# Patient Record
Sex: Female | Born: 2000 | Race: Black or African American | Hispanic: No | Marital: Single | Smoking: Never smoker
Health system: Southern US, Community
[De-identification: ages and names within clinical notes are randomized; demographics above are authoritative.]

## PROBLEM LIST (undated history)

## (undated) DIAGNOSIS — A64 Unspecified sexually transmitted disease: Secondary | ICD-10-CM

## (undated) DIAGNOSIS — A549 Gonococcal infection, unspecified: Secondary | ICD-10-CM

## (undated) DIAGNOSIS — D649 Anemia, unspecified: Secondary | ICD-10-CM

## (undated) DIAGNOSIS — A749 Chlamydial infection, unspecified: Secondary | ICD-10-CM

## (undated) HISTORY — PX: HERNIA REPAIR: SHX51

## (undated) HISTORY — DX: Gonococcal infection, unspecified: A54.9

## (undated) HISTORY — DX: Unspecified sexually transmitted disease: A64

## (undated) HISTORY — DX: Chlamydial infection, unspecified: A74.9

---
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
## 2022-03-08 ENCOUNTER — Encounter: Payer: Self-pay | Admitting: Obstetrics & Gynecology

## 2022-03-11 ENCOUNTER — Encounter: Payer: Self-pay | Admitting: Obstetrics & Gynecology

## 2022-03-11 ENCOUNTER — Ambulatory Visit (INDEPENDENT_AMBULATORY_CARE_PROVIDER_SITE_OTHER): Payer: Commercial Managed Care - HMO | Admitting: Obstetrics & Gynecology

## 2022-03-11 VITALS — BP 118/80 | HR 89

## 2022-03-11 DIAGNOSIS — Z113 Encounter for screening for infections with a predominantly sexual mode of transmission: Secondary | ICD-10-CM | POA: Diagnosis not present

## 2022-03-11 DIAGNOSIS — N926 Irregular menstruation, unspecified: Secondary | ICD-10-CM

## 2022-03-11 DIAGNOSIS — Z3169 Encounter for other general counseling and advice on procreation: Secondary | ICD-10-CM

## 2022-03-11 LAB — PREGNANCY, URINE: Preg Test, Ur: NEGATIVE

## 2022-03-11 NOTE — Progress Notes (Signed)
    Hannah Ramos [birth year] [id number]        22 y.o.  G0 Living with her boyfriend.  RP: Irregular menstrual periods  HPI: Irregular menstrual periods.  Last periods 17 days apart. LMP 02/09/2022.  Had her last DepoProvera injection in 09/2021.  No contraception since then, would like to conceive. Sexually active.  H/O Gonorrhea and Chlamydia infections.  Last episode > 1 year ago.  Gono-Chlam Neg in 02/2021 and in 07/2021.     OB History  Gravida Para Term Preterm AB Living  0 0 0 0 0 0  SAB IAB Ectopic Multiple Live Births  0 0 0 0 0    Past medical history,surgical history, problem list, medications, allergies, family history and social history were all reviewed and documented in the EPIC chart.   Directed ROS with pertinent positives and negatives documented in the history of present illness/assessment and plan.  Exam:  Vitals:   03/11/22 1532  BP: 118/80  Pulse: 89  SpO2: 99%   General appearance:  Normal  Abdomen: Normal  Gynecologic exam: Vulva normal.  Speculum:  Cervix/Vagina normal.  SureSwab Advanced Plus done.  Bimanual exam:  Uterus AV, normal volume, mobile, NT.  No adnexal mass, NT.  UPT Neg   Assessment/Plan:  22 y.o. G0   1. Irregular menstrual cycle Irregular menstrual periods.  Last periods 17 days apart. LMP 02/09/2022.  Had her last DepoProvera injection in 09/2021.  No contraception since then, would like to conceive. Sexually active.  H/O Gonorrhea and Chlamydia infections.  Last episode > 1 year ago.  Gono-Chlam Neg in 02/2021 and in 07/2021.  SureSwab Advanced Plus pending.  UPT Neg today.  Counseling done on the fact that it may take up to a year after the last DepoProvera before resuming regular ovulatory cycles, which will be 09/2022 for her.  Recommend reevaluating if no regular cycles by then. - Pregnancy, urine  2. Encounter for preconception consultation Recommend PNVs, healthy nutrition and regular physical activities.  Timed IC when cycles  are regular again.  Consult early to r/o ectopic if HPT positive given the h/o Gono and Chlam in the past.  3. Screen for STD (sexually transmitted disease) - SureSwab Advanced Vaginitis Plus,TMA   Hannah Bruins MD, 3:43 PM 03/11/2022

## 2022-03-12 LAB — SURESWAB® ADVANCED VAGINITIS PLUS,TMA
C. trachomatis RNA, TMA: DETECTED — AB
CANDIDA SPECIES: NOT DETECTED
Candida glabrata: NOT DETECTED
N. gonorrhoeae RNA, TMA: DETECTED — AB
SURESWAB(R) ADV BACTERIAL VAGINOSIS(BV),TMA: POSITIVE — AB
TRICHOMONAS VAGINALIS (TV),TMA: NOT DETECTED

## 2022-03-31 ENCOUNTER — Other Ambulatory Visit: Payer: Self-pay

## 2022-03-31 MED ORDER — DOXYCYCLINE HYCLATE 100 MG PO TBEC: 100.0000 mg | DELAYED_RELEASE_TABLET | Freq: Two times a day (BID) | ORAL | 0 refills | Status: DC

## 2022-03-31 MED ORDER — TINIDAZOLE 500 MG PO TABS: ORAL_TABLET | ORAL | 0 refills | Status: DC

## 2022-04-01 ENCOUNTER — Telehealth: Payer: Self-pay | Admitting: *Deleted

## 2022-04-01 ENCOUNTER — Ambulatory Visit: Payer: Commercial Managed Care - HMO

## 2022-04-01 NOTE — Telephone Encounter (Addendum)
Mark the Pharmacist that works at [employer] called stating: They do not have the doxycycline (dorx) 100 mg in stock.   Can we approve Doxycycline Hyclate?  Routing to Provider for recommendations.   Please refer to SureSwab date 03/11/2022 as a reference

## 2022-04-02 MED ORDER — DOXYCYCLINE HYCLATE 100 MG PO CAPS: 100.0000 mg | ORAL_CAPSULE | Freq: Two times a day (BID) | ORAL | 0 refills | Status: DC

## 2022-04-02 NOTE — Telephone Encounter (Signed)
Phoned-in medication to pharmacy.

## 2022-04-02 NOTE — Addendum Note (Signed)
Addended by: Janalyn Harder A on: 04/02/2022 03:00 PM   Modules accepted: Orders

## 2022-04-02 NOTE — Telephone Encounter (Signed)
Yes, that's fine 

## 2022-04-07 ENCOUNTER — Telehealth: Payer: Self-pay

## 2022-04-07 ENCOUNTER — Other Ambulatory Visit: Payer: Self-pay

## 2022-04-07 MED ORDER — METRONIDAZOLE 500 MG PO TABS: 500.0000 mg | ORAL_TABLET | Freq: Two times a day (BID) | ORAL | 0 refills | Status: DC

## 2022-04-07 NOTE — Telephone Encounter (Signed)
I tried to contact patient but phone number is not working. I don't see where patient gave [place] her updated number. I will send her a mychart message. Per Dr Dellis Filbert, patient can take metronidazole 567m bid x 5 days.

## 2022-04-07 NOTE — Telephone Encounter (Signed)
Prior authorization was needed for tinadazole 550m. It was denied. Please send in another medication for patient if appropriate.  The denial letter said that they will approve metronidazole

## 2022-04-13 ENCOUNTER — Encounter: Payer: Self-pay | Admitting: *Deleted

## 2022-04-21 ENCOUNTER — Encounter: Payer: Self-pay | Admitting: *Deleted

## 2022-06-24 ENCOUNTER — Emergency Department (HOSPITAL_COMMUNITY)
Admission: EM | Admit: 2022-06-24 | Discharge: 2022-06-24 | Disposition: A | Discharge status: Home / Self Care | Payer: Medicaid Other | Attending: Student | Admitting: Student

## 2022-06-24 ENCOUNTER — Other Ambulatory Visit: Payer: Self-pay

## 2022-06-24 ENCOUNTER — Encounter (HOSPITAL_COMMUNITY): Payer: Self-pay

## 2022-06-24 DIAGNOSIS — N939 Abnormal uterine and vaginal bleeding, unspecified: Secondary | ICD-10-CM | POA: Diagnosis present

## 2022-06-24 DIAGNOSIS — Z91010 Allergy to peanuts: Secondary | ICD-10-CM | POA: Insufficient documentation

## 2022-06-24 DIAGNOSIS — A64 Unspecified sexually transmitted disease: Secondary | ICD-10-CM | POA: Diagnosis not present

## 2022-06-24 LAB — CBC WITH DIFFERENTIAL/PLATELET
Abs Immature Granulocytes: 0.01 10*3/uL (ref 0.00–0.07)
Basophils Absolute: 0 10*3/uL (ref 0.0–0.1)
Basophils Relative: 0 %
Eosinophils Absolute: 0.1 10*3/uL (ref 0.0–0.5)
Eosinophils Relative: 1 %
HCT: 33.3 % — ABNORMAL LOW (ref 36.0–46.0)
Hemoglobin: 10.3 g/dL — ABNORMAL LOW (ref 12.0–15.0)
Immature Granulocytes: 0 %
Lymphocytes Relative: 46 %
Lymphs Abs: 2.9 10*3/uL (ref 0.7–4.0)
MCH: 24.3 pg — ABNORMAL LOW (ref 26.0–34.0)
MCHC: 30.9 g/dL (ref 30.0–36.0)
MCV: 78.7 fL — ABNORMAL LOW (ref 80.0–100.0)
Monocytes Absolute: 0.5 10*3/uL (ref 0.1–1.0)
Monocytes Relative: 8 %
Neutro Abs: 2.9 10*3/uL (ref 1.7–7.7)
Neutrophils Relative %: 45 %
Platelets: 164 10*3/uL (ref 150–400)
RBC: 4.23 MIL/uL (ref 3.87–5.11)
RDW: 13.8 % (ref 11.5–15.5)
WBC: 6.4 10*3/uL (ref 4.0–10.5)
nRBC: 0 % (ref 0.0–0.2)

## 2022-06-24 LAB — URINALYSIS, ROUTINE W REFLEX MICROSCOPIC
Bacteria, UA: NONE SEEN
Bilirubin Urine: NEGATIVE
Glucose, UA: NEGATIVE mg/dL
Ketones, ur: NEGATIVE mg/dL
Leukocytes,Ua: NEGATIVE
Nitrite: NEGATIVE
Protein, ur: NEGATIVE mg/dL
Specific Gravity, Urine: 1.025 (ref 1.005–1.030)
pH: 6 (ref 5.0–8.0)

## 2022-06-24 LAB — COMPREHENSIVE METABOLIC PANEL
ALT: 15 U/L (ref 0–44)
AST: 15 U/L (ref 15–41)
Albumin: 4.2 g/dL (ref 3.5–5.0)
Alkaline Phosphatase: 61 U/L (ref 38–126)
Anion gap: 8 (ref 5–15)
BUN: 15 mg/dL (ref 6–20)
CO2: 26 mmol/L (ref 22–32)
Calcium: 8.9 mg/dL (ref 8.9–10.3)
Chloride: 103 mmol/L (ref 98–111)
Creatinine, Ser: 0.73 mg/dL (ref 0.44–1.00)
GFR, Estimated: >60 mL/min (ref >60)
Glucose, Bld: 91 mg/dL (ref 70–99)
Potassium: 3.2 mmol/L — ABNORMAL LOW (ref 3.5–5.1)
Sodium: 137 mmol/L (ref 135–145)
Total Bilirubin: 0.3 mg/dL (ref 0.3–1.2)
Total Protein: 8.1 g/dL (ref 6.5–8.1)

## 2022-06-24 LAB — HCG, SERUM, QUALITATIVE: Preg, Serum: NEGATIVE

## 2022-06-24 MED ORDER — DOXYCYCLINE HYCLATE 100 MG PO TABS
100.0000 mg | ORAL_TABLET | Freq: Once | ORAL | Status: AC
  Administered 2022-06-24: 100 mg via ORAL
  Filled 2022-06-24: qty 1

## 2022-06-24 MED ORDER — DOXYCYCLINE HYCLATE 100 MG PO CAPS: 100.0000 mg | ORAL_CAPSULE | Freq: Two times a day (BID) | ORAL | 0 refills | Status: AC

## 2022-06-24 MED ORDER — CEFTRIAXONE SODIUM 1 G IJ SOLR
500.0000 mg | Freq: Once | INTRAMUSCULAR | Status: AC
  Administered 2022-06-24: 500 mg via INTRAMUSCULAR
  Filled 2022-06-24: qty 10

## 2022-06-24 NOTE — ED Triage Notes (Signed)
Pt to er, pt states that she is having some vaginal bleeding, lower abd cramping, states that recently she tested positive for gonorrhea and chlamydia, and would like to be treated for this.  States that she has been passing the clots for the past week.  Pt states that it is similar amount to her period.

## 2022-06-24 NOTE — Discharge Instructions (Signed)
You were seen in the emergency department for vaginal bleeding. You also previously tested positive for gonorrhea and chlamydia but had not been treated for this. Based on labs and physical exam, there was only a small amount of vaginal bleeding noted and no evidence of bleeding or fluid from the cervix. I would advise following up with your PCP for further evaluation. You were given a dose of Rocephin here in the ED and a prescription for doxycycline was sent to your pharmacy to further treat the STIs. Please avoid sexual activity for a full week until you have finished these antibiotics as you may still be infectious and could spread this infection. Also have any partners treated for STIs as well.

## 2022-06-24 NOTE — ED Notes (Signed)
Patient given discharge instructions and follow up care. Patient verbalized understanding. IV removed with catheter intact. Patient amb out of ED.
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
## 2022-08-06 ENCOUNTER — Emergency Department (HOSPITAL_COMMUNITY)
Admission: EM | Admit: 2022-08-06 | Discharge: 2022-08-06 | Disposition: A | Discharge status: Home / Self Care | Payer: Medicaid Other | Attending: Emergency Medicine | Admitting: Emergency Medicine

## 2022-08-06 ENCOUNTER — Encounter (HOSPITAL_COMMUNITY): Payer: Self-pay

## 2022-08-06 DIAGNOSIS — N730 Acute parametritis and pelvic cellulitis: Secondary | ICD-10-CM

## 2022-08-06 DIAGNOSIS — N739 Female pelvic inflammatory disease, unspecified: Secondary | ICD-10-CM | POA: Insufficient documentation

## 2022-08-06 DIAGNOSIS — R1030 Lower abdominal pain, unspecified: Secondary | ICD-10-CM | POA: Diagnosis present

## 2022-08-06 LAB — URINALYSIS, ROUTINE W REFLEX MICROSCOPIC
Bilirubin Urine: NEGATIVE
Glucose, UA: NEGATIVE mg/dL
Hgb urine dipstick: NEGATIVE
Ketones, ur: 5 mg/dL — AB
Nitrite: NEGATIVE
Protein, ur: 30 mg/dL — AB
Specific Gravity, Urine: 1.018 (ref 1.005–1.030)
WBC, UA: >50 WBC/hpf (ref 0–5)
pH: 6 (ref 5.0–8.0)

## 2022-08-06 LAB — HIV ANTIBODY (ROUTINE TESTING W REFLEX): HIV Screen 4th Generation wRfx: NONREACTIVE

## 2022-08-06 LAB — WET PREP, GENITAL
Clue Cells Wet Prep HPF POC: NONE SEEN
Trich, Wet Prep: NONE SEEN
WBC, Wet Prep HPF POC: >=10 — AB (ref <10)
Yeast Wet Prep HPF POC: NONE SEEN

## 2022-08-06 LAB — RPR: RPR Ser Ql: NONREACTIVE

## 2022-08-06 LAB — PREGNANCY, URINE: Preg Test, Ur: NEGATIVE

## 2022-08-06 MED ORDER — IBUPROFEN 600 MG PO TABS: 600.0000 mg | ORAL_TABLET | Freq: Four times a day (QID) | ORAL | 0 refills | Status: DC | PRN

## 2022-08-06 MED ORDER — CEFTRIAXONE SODIUM 1 G IJ SOLR
500.0000 mg | Freq: Once | INTRAMUSCULAR | Status: AC
  Administered 2022-08-06: 500 mg via INTRAMUSCULAR
  Filled 2022-08-06: qty 10

## 2022-08-06 MED ORDER — LIDOCAINE HCL (PF) 1 % IJ SOLN
INTRAMUSCULAR | Status: AC
  Administered 2022-08-06: 30 mL
  Filled 2022-08-06: qty 30

## 2022-08-06 MED ORDER — DOXYCYCLINE HYCLATE 100 MG PO CAPS: 100.0000 mg | ORAL_CAPSULE | Freq: Two times a day (BID) | ORAL | 0 refills | Status: DC

## 2022-08-06 NOTE — ED Triage Notes (Signed)
Pt presents with c/o lower abdominal pain and low back pain. Pt reports that the pain started about a week ago and she has had some vomiting with it as well, vomiting has resolved at this time.

## 2022-08-06 NOTE — Discharge Instructions (Signed)
You have been evaluated for your symptoms.  Your urine did show signs concerning for urinary tract infection.  In the setting of having vaginal discharge and having lower abdominal cramping, you will be treated for potential pelvic inflammatory disease with antibiotic.  You may follow-up with your lab result through MyChart but if you do test positive for infection, the current antibiotic will provide treatment for it.  Avoid sexual activities until your symptoms completely resolved.  Always use protection.

## 2022-08-06 NOTE — ED Notes (Signed)
**   ONLY used 2.1 ml of the (pf) Lidocaine 1% to mix with the Rocephin

## 2022-08-06 NOTE — ED Provider Notes (Signed)
[place] EMERGENCY DEPARTMENT AT [facility] Provider Note   CSN: [id number] Arrival date & time: 08/06/22  0844     History  No chief complaint on file.   Hannah Ramos is a 22 y.o. female.  The history is provided by the patient and medical records. No language interpreter was used.     22 year old female presenting with complaints of abdominal pain.  Patient states 4 days ago she ate some food and somebody else got for her and shortly after that she developed abdominal discomfort.  She described as cramping sensation to her lower back with associate nausea vomiting and diarrhea.  Symptom lasting for about 2 days and that has since resolved however she still have lingering lower abdominal pain.  Furthermore she mention noticing having some vaginal discharge while she is on her menstruation.  This is an ongoing issue for the past 4 days as well.  She has had history of gonorrhea and chlamydia in the past.  She admits to being sexually active and not using protection every single time.  She reports concern for potential STI.  She denies any abnormal rash.  She denies any specific treatment tried at home.  Partner without any symptoms.  Patient also denies having fever or dysuria.  Home Medications Prior to Admission medications   Medication Sig Start Date End Date Taking? Authorizing Provider  fluticasone (FLONASE) 50 MCG/ACT nasal spray Place 2 sprays into both nostrils daily for 7 days. 01/23/21 03/11/22  Long, Arlyss Repress, MD  metroNIDAZOLE (FLAGYL) 500 MG tablet Take 1 tablet (500 mg total) by mouth 2 (two) times daily. 04/07/22   Genia Del, MD  tinidazole (TINDAMAX) 500 MG tablet S: Take 2 tabs PO BID x 2 days after treating other infection. 03/31/22   Genia Del, MD      Allergies    Peanut butter flavor and Amoxicillin    Review of Systems   Review of Systems  All other systems reviewed and are negative.   Physical Exam Updated Vital Signs BP  136/75 (BP Location: Left Arm)   Pulse 74   Temp 98.1 F (36.7 C) (Oral)   Resp 18   SpO2 98%  Physical Exam Vitals and nursing note reviewed.  Constitutional:      General: She is not in acute distress.    Appearance: She is well-developed.  HENT:     Head: Atraumatic.  Eyes:     Conjunctiva/sclera: Conjunctivae normal.  Cardiovascular:     Rate and Rhythm: Normal rate and regular rhythm.  Pulmonary:     Effort: Pulmonary effort is normal.  Abdominal:     Palpations: Abdomen is soft.     Tenderness: There is no abdominal tenderness. There is no right CVA tenderness or left CVA tenderness.  Musculoskeletal:     Cervical back: Neck supple.  Skin:    Findings: No rash.  Neurological:     Mental Status: She is alert.  Psychiatric:        Mood and Affect: Mood normal.     ED Results / Procedures / Treatments   Labs (all labs ordered are listed, but only abnormal results are displayed) Labs Reviewed  WET PREP, GENITAL - Abnormal; Notable for the following components:      Result Value   WBC, Wet Prep HPF POC >=10 (*)    All other components within normal limits  URINALYSIS, ROUTINE W REFLEX MICROSCOPIC - Abnormal; Notable for the following components:   APPearance HAZY (*)  Ketones, ur 5 (*)    Protein, ur 30 (*)    Leukocytes,Ua LARGE (*)    Bacteria, UA FEW (*)    All other components within normal limits  PREGNANCY, URINE  RPR  HIV ANTIBODY (ROUTINE TESTING W REFLEX)  POC URINE PREG, ED  GC/CHLAMYDIA PROBE AMP ([place]) NOT AT [facility]    EKG None  Radiology No results found.  Procedures Pelvic exam  Date/Time: 08/06/2022 9:46 AM  Performed by: Fayrene Helper, PA-C Authorized by: Fayrene Helper, PA-C  Comments: Chaperone present during exam.  No inguinal lymphadenopathy or inguinal hernia noted.  Normal external genitalia.  No significant discomfort with speculum insertion.  Close cervical os free of lesion or rash.  Normal vaginal vault without any  significant discharge or bleeding.  On bimanual exam patient does have some cervical motion tenderness but no adnexal tenderness.       Medications Ordered in ED Medications  cefTRIAXone (ROCEPHIN) injection 500 mg (500 mg Intramuscular Given 08/06/22 1009)  lidocaine (PF) (XYLOCAINE) 1 % injection (30 mLs  Given 08/06/22 1011)    ED Course/ Medical Decision Making/ A&P                             Medical Decision Making Amount and/or Complexity of Data Reviewed Labs: ordered.  Risk Prescription drug management.   BP 136/75 (BP Location: Left Arm)   Pulse 74   Temp 98.1 F (36.7 C) (Oral)   Resp 18   LMP 07/13/2022   SpO2 98%   30:6 AM  22 year old female presenting with complaints of abdominal pain.  Patient states 4 days ago she ate some food and somebody else got for her and shortly after that she developed abdominal discomfort.  She described as cramping sensation to her lower back with associate nausea vomiting and diarrhea.  Symptom lasting for about 2 days and that has since resolved however she still have lingering lower abdominal pain.  Furthermore she mention noticing having some vaginal discharge while she is on her menstruation.  This is an ongoing issue for the past 4 days as well.  She has had history of gonorrhea and chlamydia in the past.  She admits to being sexually active and not using protection every single time.  She reports concern for potential STI.  She denies any abnormal rash.  She denies any specific treatment tried at home.  Partner without any symptoms.  Patient also denies having fever or dysuria.  On exam this is a well-appearing female appears to be in no acute discomfort.  Heart with normal rate and rhythm, lungs clear to auscultation bilaterally abdomen is soft nontender no CVA tenderness.  Pelvic exam remarkable for cervical motion tenderness however patient without any significant discharge.  Will preemptively treated with Rocephin for possible  STI.   -Labs ordered, independently viewed and interpreted by me.  Labs remarkable for UA shwoing large leukocytes and >50 WBC.  Wet prep with sperm presence. Preg test negative.  -Imaging including transvaginal US considered but not performed due to low suspicion for TOA, ovarian torsion, or ectopic pregnancy -This patient presents to the ED for concern of lower abd pain, this involves an extensive number of treatment options, and is a complaint that carries with it a high risk of complications and morbidity.  The differential diagnosis includes PID, cervicitis, appendicitis, colitis, uti, pyelo -Co morbidities that complicate the patient evaluation includes prior STI -Treatment includes rocephin -Reevaluation  of the patient after these medicines showed that the patient stayed the same -PCP office notes or outside notes reviewed -Escalation to admission/observation considered: patients feels much better, is comfortable with discharge, and will follow up with PCP -Prescription medication considered, patient comfortable with doxycycline -Social Determinant of Health considered          Final Clinical Impression(s) / ED Diagnoses Final diagnoses:  PID (acute pelvic inflammatory disease)    Rx / DC Orders ED Discharge Orders          Ordered    doxycycline (VIBRAMYCIN) 100 MG capsule  2 times daily        08/06/22 1048    ibuprofen (ADVIL) 600 MG tablet  Every 6 hours PRN        08/06/22 1048              Fayrene Helper, PA-C 08/06/22 1050    Linwood Dibbles, MD 08/07/22 0700

## 2022-08-07 LAB — GC/CHLAMYDIA PROBE AMP (~~LOC~~) NOT AT ARMC
Chlamydia: POSITIVE — AB
Comment: NEGATIVE
Comment: NORMAL
Neisseria Gonorrhea: POSITIVE — AB

## 2022-12-01 ENCOUNTER — Ambulatory Visit: Payer: Commercial Managed Care - HMO | Admitting: Radiology

## 2023-01-02 IMAGING — US US TRANSVAGINAL NON-OB
1 series · 15 of 25 positions shown · non-contrast
Comparison: None.

CLINICAL DATA: Pelvic pain with abnormal vaginal bleeding

EXAM:
TRANSABDOMINAL AND TRANSVAGINAL ULTRASOUND OF PELVIS
DOPPLER ULTRASOUND OF OVARIES
TECHNIQUE: Both transabdominal and transvaginal ultrasound examinations of the
pelvis were performed. Transabdominal technique was performed for
global imaging of the pelvis including uterus, ovaries, adnexal
regions, and pelvic cul-de-sac.
It was necessary to proceed with endovaginal exam following the
transabdominal exam to visualize the uterus endometrium ovaries.
Color and duplex Doppler ultrasound was utilized to evaluate blood
flow to the ovaries.

[Series 1: us pelvis complete mc & wl · 15 of 79 slices shown]
[im 1/79]
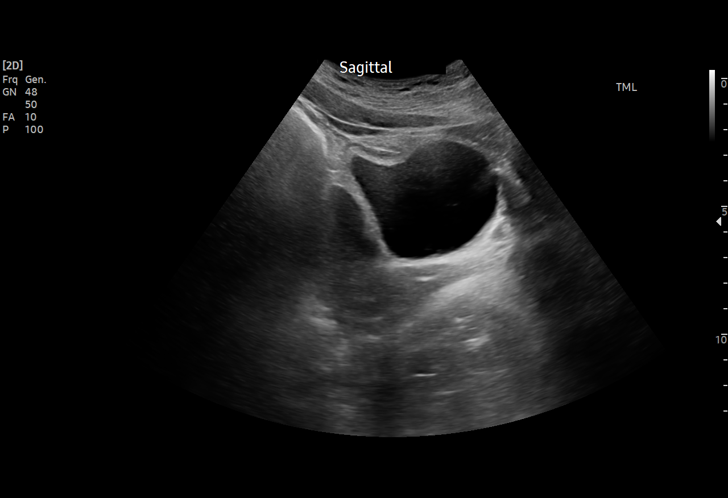
[im 7/79]
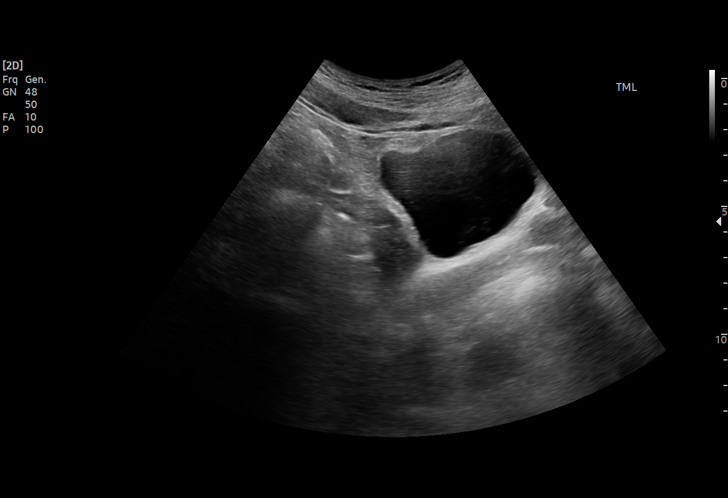
[im 14/79]
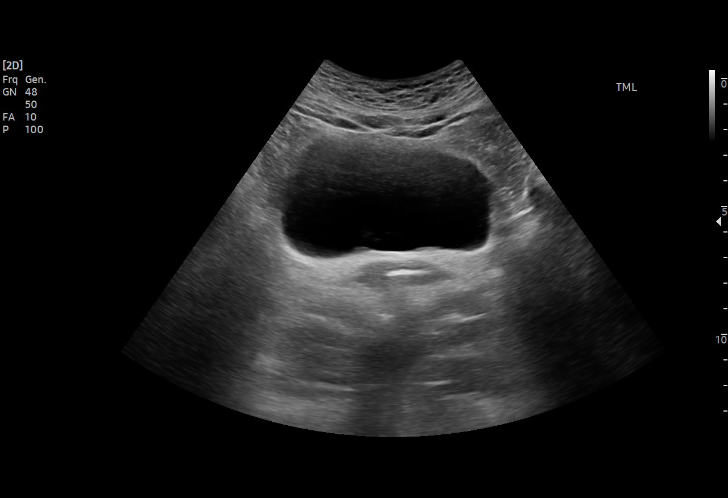
[im 17/79]
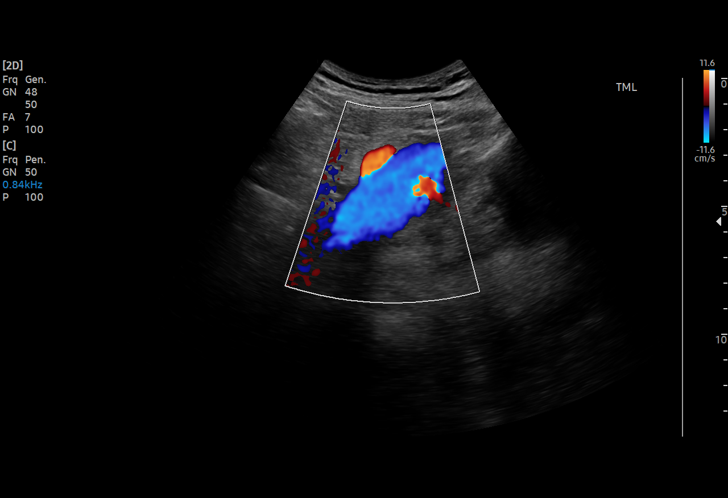
[im 23/79]
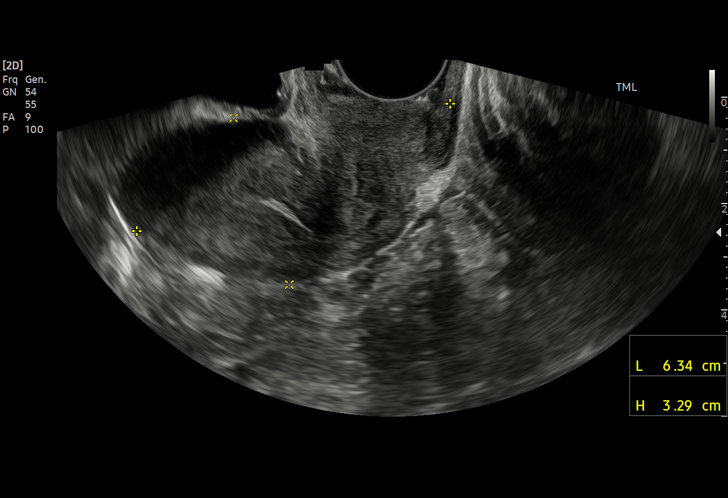
[im 30/79]
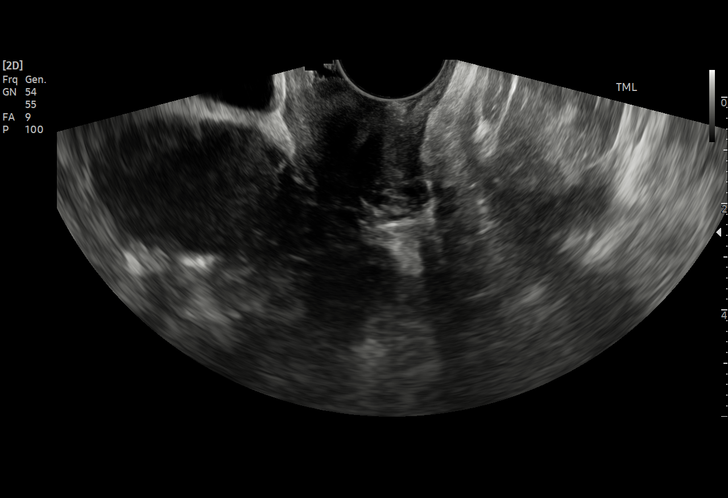
[im 33/79]
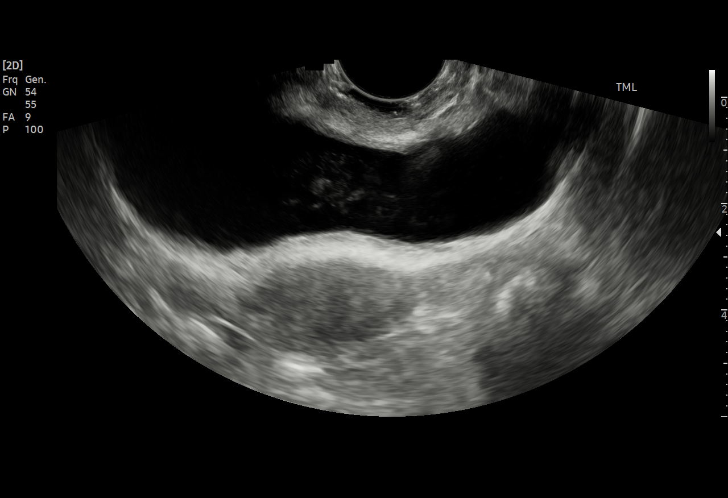
[im 40/79]
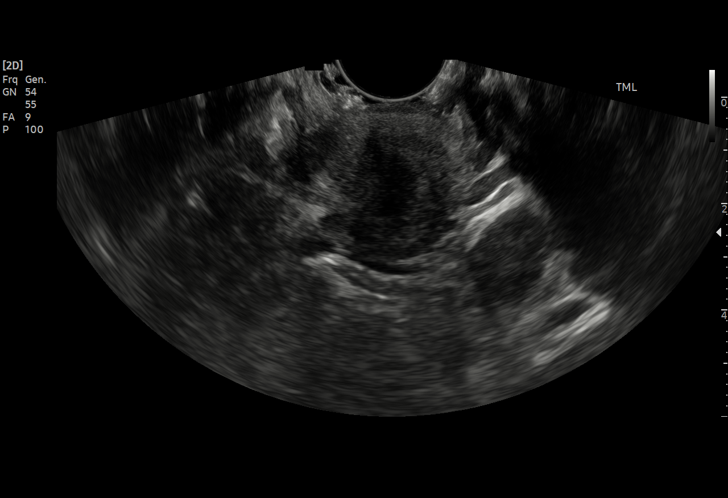
[im 46/79]
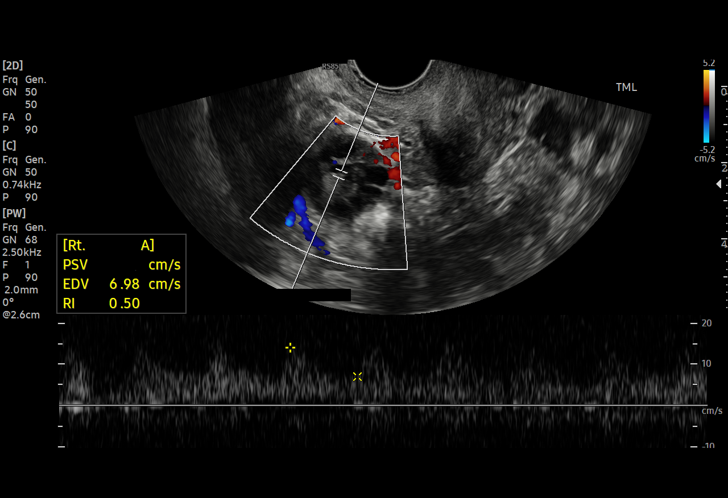
[im 49/79]
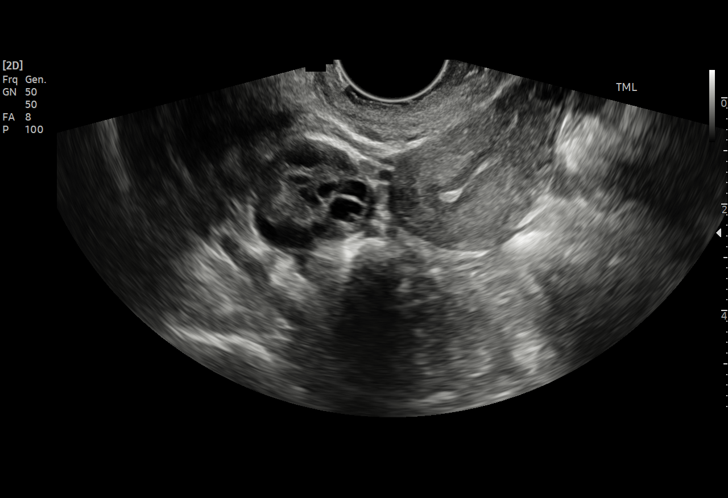
[im 56/79]
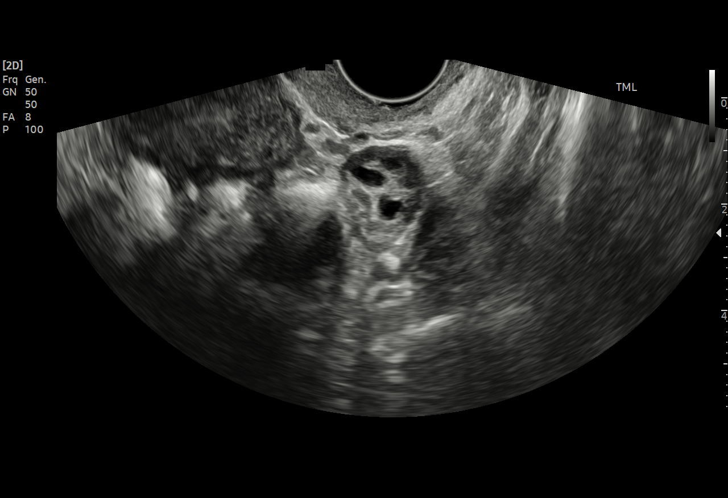
[im 62/79]
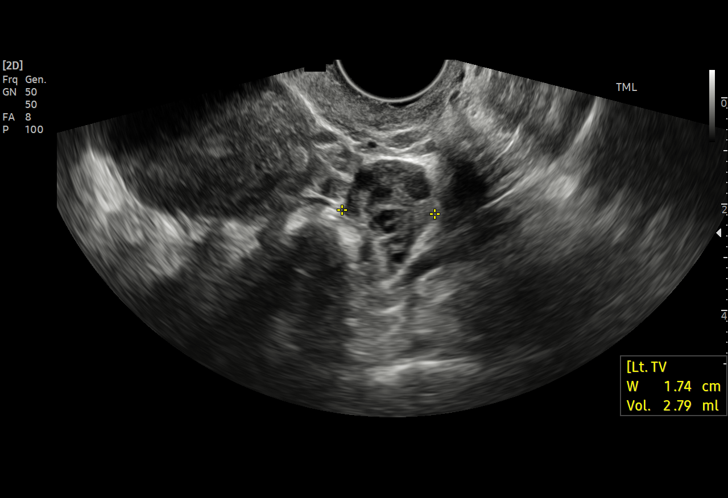
[im 66/79]
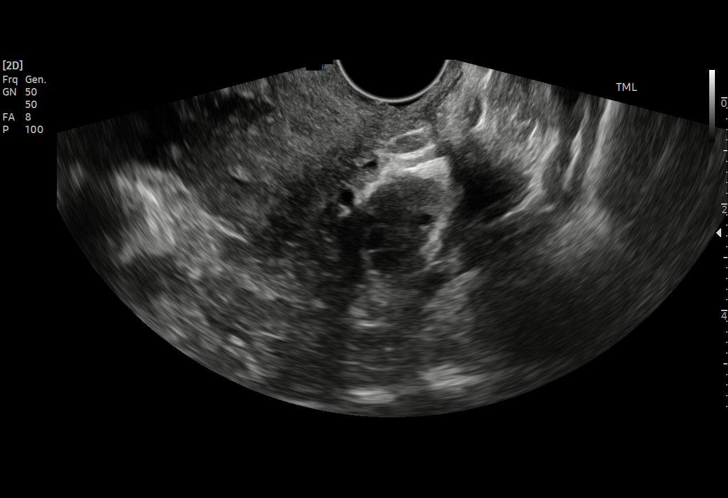
[im 72/79]
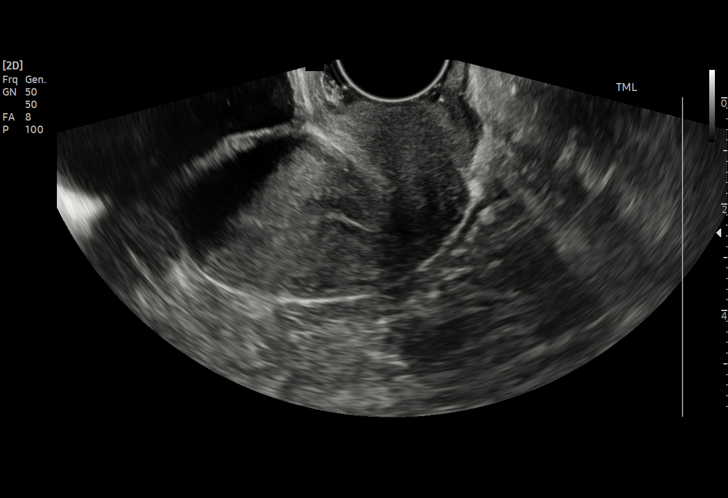
[im 79/79]
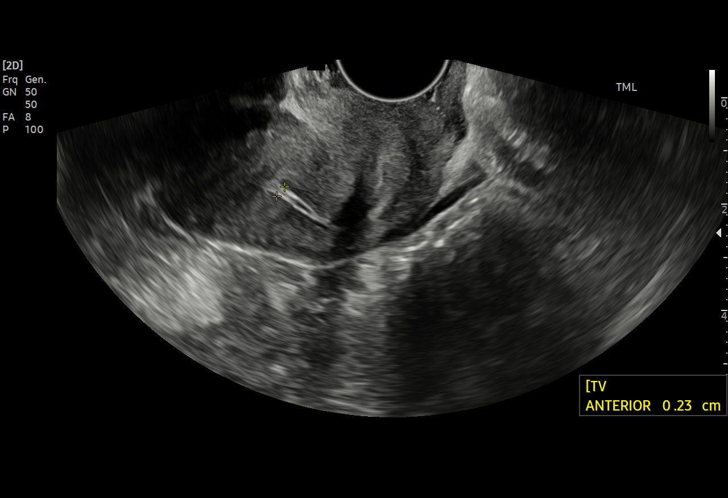

[15 of 25 positions shown; findings below may reference images not displayed]

FINDINGS: Uterus

Measurements: 6.3 x 3.3 x 3.4 cm = volume: 37.2 mL. No fibroids or
other mass visualized.

Endometrium

Thickness: 2.3 mm.  No focal abnormality visualized.

Right ovary

Measurements: 2.6 x 2.7 x 2.3 cm = volume: 8.1 mL. Normal
appearance/no adnexal mass.

Left ovary

Measurements: 2 x 1.6 x 1.7 cm = volume: 2.8 mL. Normal
appearance/no adnexal mass.

Pulsed Doppler evaluation of both ovaries demonstrates normal
low-resistance arterial and venous waveforms.

Other findings

No abnormal free fluid.
IMPRESSION: Negative pelvic ultrasound. No evidence for torsion or ovarian mass.

## 2023-01-02 IMAGING — US US ART/VEN ABD/PELV/SCROTUM DOPPLER LTD
1 series · 15 of 25 positions shown · non-contrast
Comparison: None.

CLINICAL DATA: Pelvic pain with abnormal vaginal bleeding

EXAM:
TRANSABDOMINAL AND TRANSVAGINAL ULTRASOUND OF PELVIS
DOPPLER ULTRASOUND OF OVARIES
TECHNIQUE: Both transabdominal and transvaginal ultrasound examinations of the
pelvis were performed. Transabdominal technique was performed for
global imaging of the pelvis including uterus, ovaries, adnexal
regions, and pelvic cul-de-sac.
It was necessary to proceed with endovaginal exam following the
transabdominal exam to visualize the uterus endometrium ovaries.
Color and duplex Doppler ultrasound was utilized to evaluate blood
flow to the ovaries.

[Series 1: us pelvis complete mc & wl · 15 of 79 slices shown]
[im 1/79]
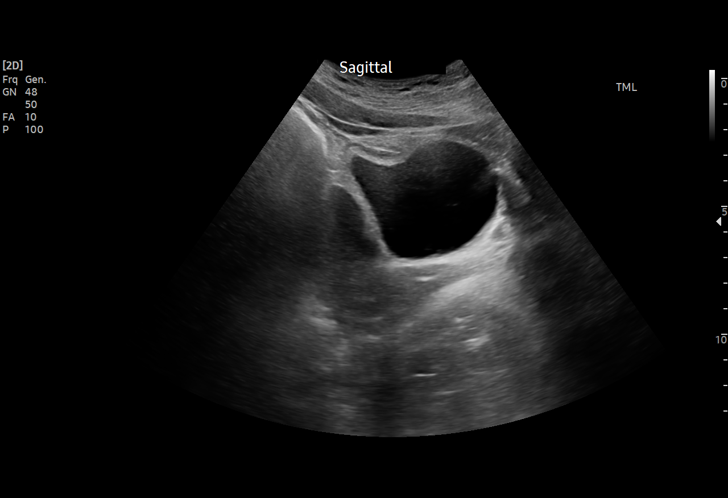
[im 7/79]
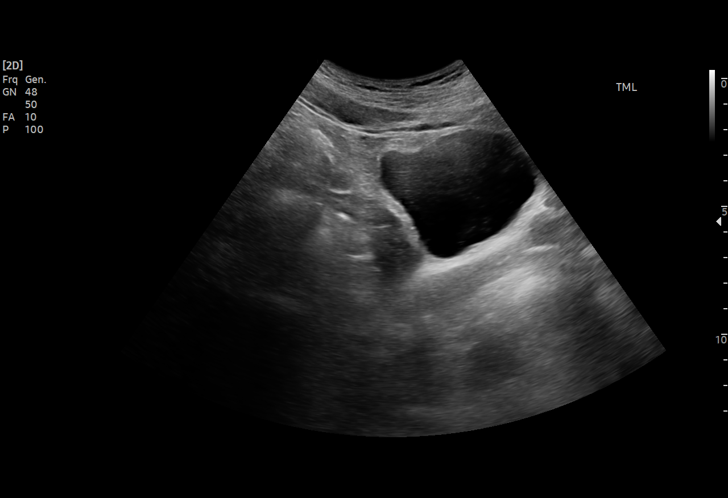
[im 14/79]
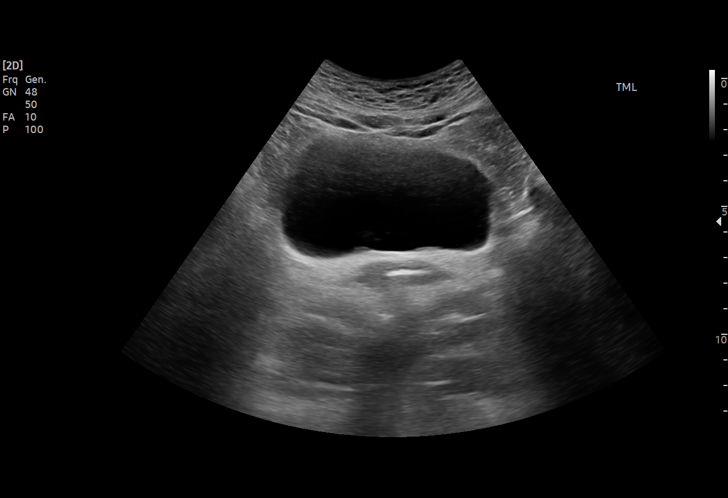
[im 17/79]
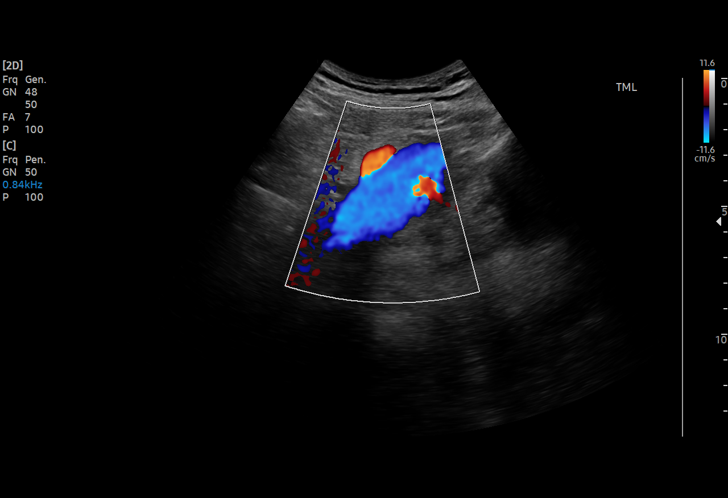
[im 23/79]
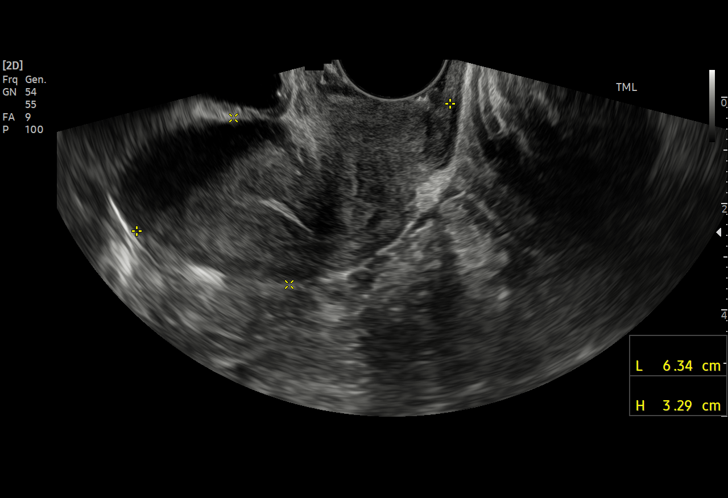
[im 30/79]
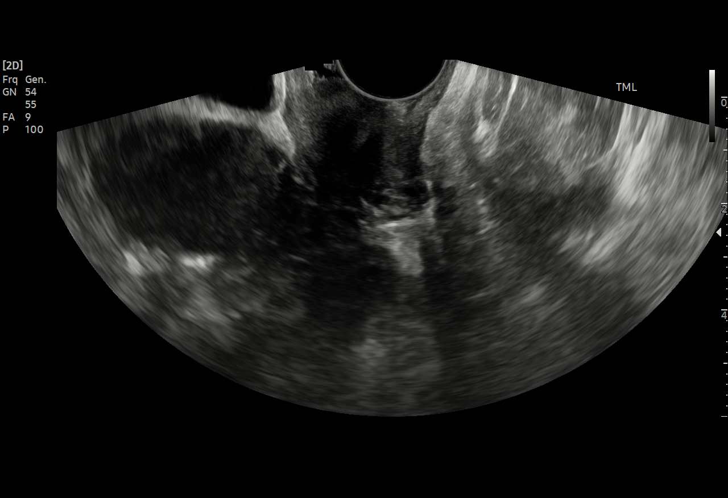
[im 33/79]
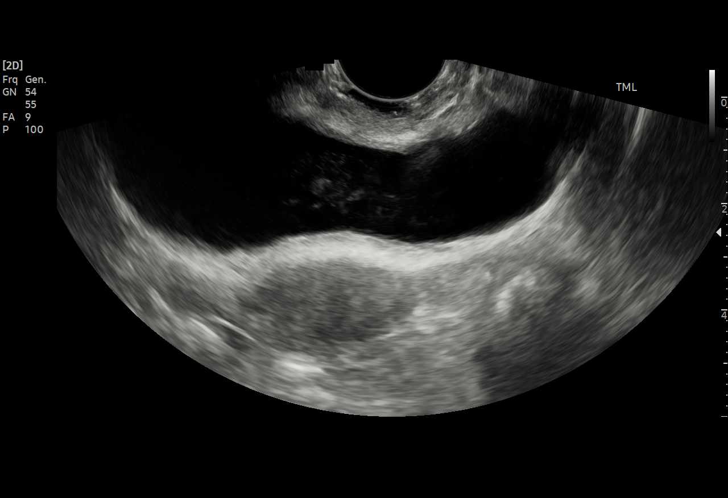
[im 40/79]
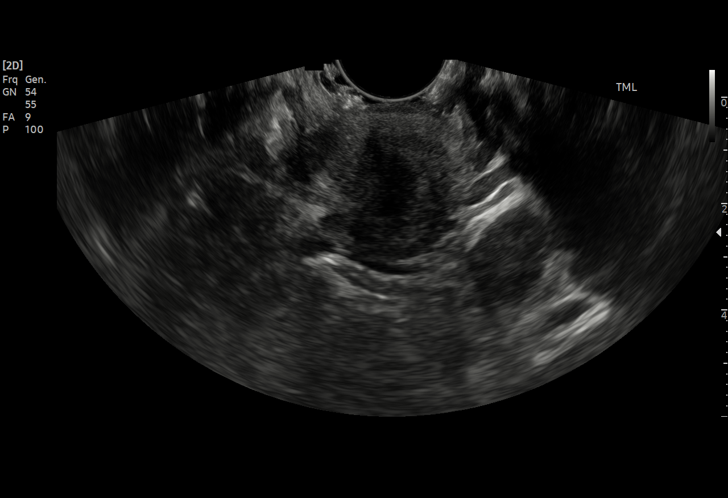
[im 46/79]
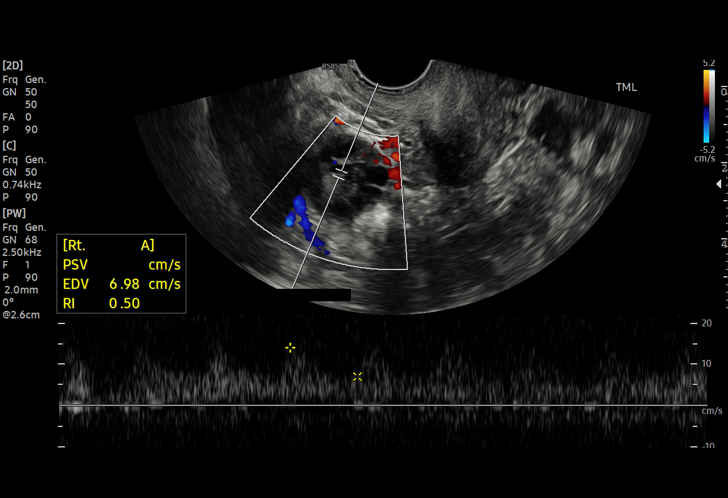
[im 49/79]
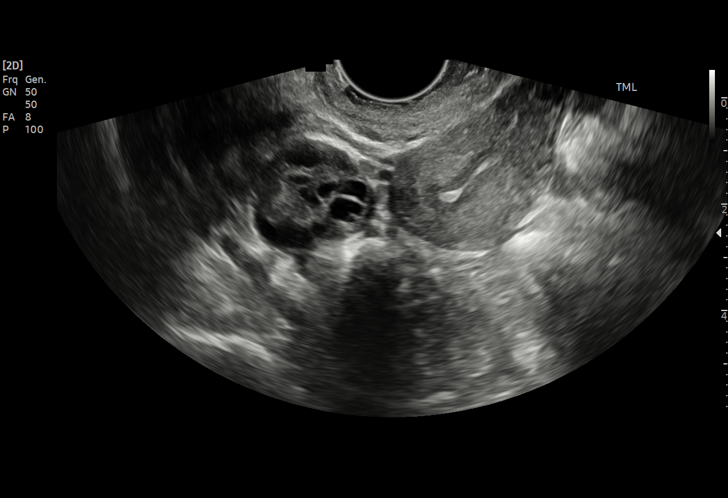
[im 56/79]
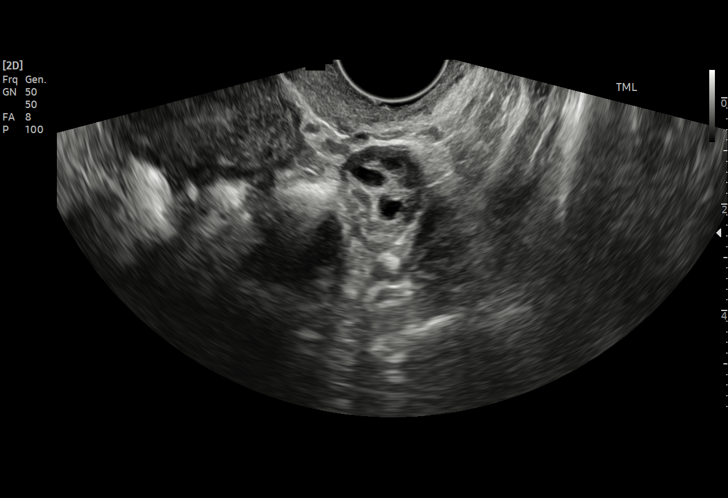
[im 62/79]
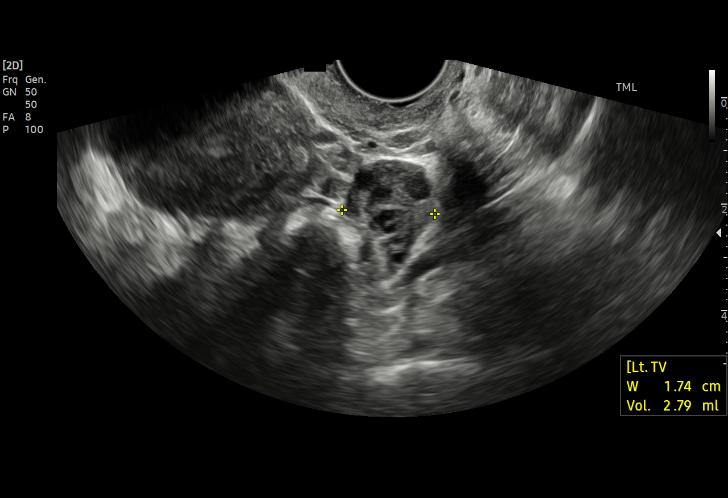
[im 66/79]
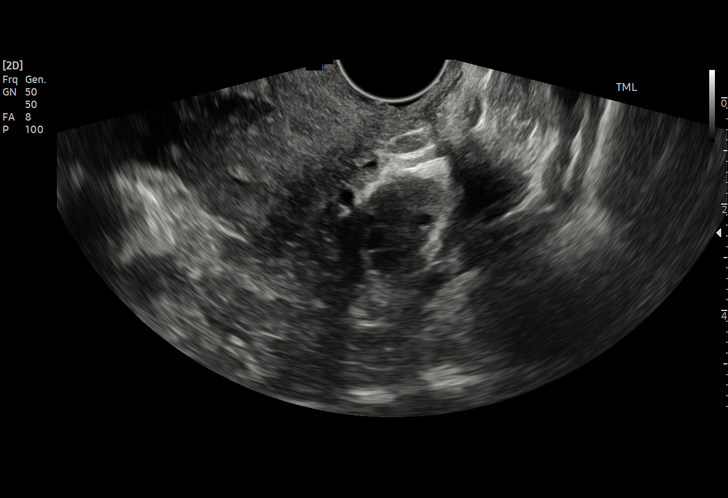
[im 72/79]
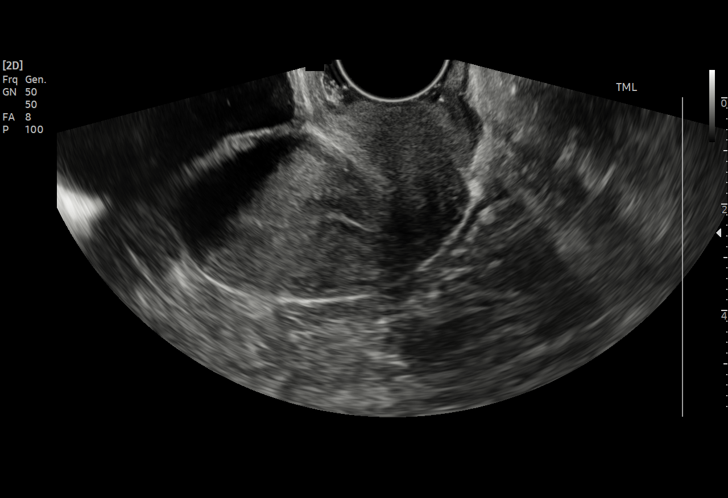
[im 79/79]
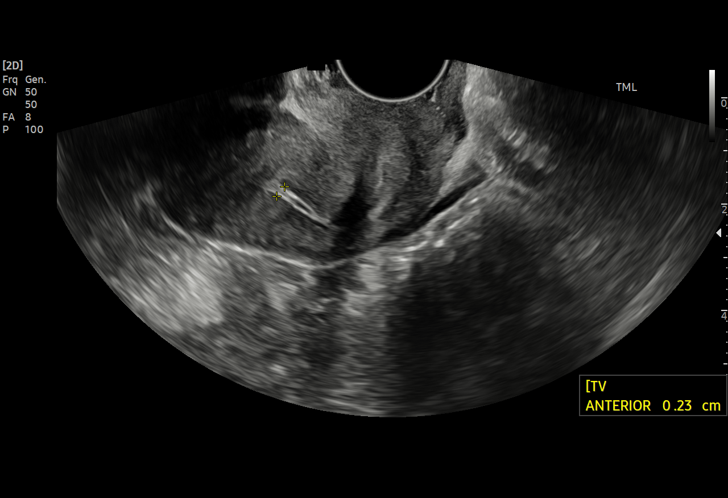

[15 of 25 positions shown; findings below may reference images not displayed]

FINDINGS: Uterus

Measurements: 6.3 x 3.3 x 3.4 cm = volume: 37.2 mL. No fibroids or
other mass visualized.

Endometrium

Thickness: 2.3 mm.  No focal abnormality visualized.

Right ovary

Measurements: 2.6 x 2.7 x 2.3 cm = volume: 8.1 mL. Normal
appearance/no adnexal mass.

Left ovary

Measurements: 2 x 1.6 x 1.7 cm = volume: 2.8 mL. Normal
appearance/no adnexal mass.

Pulsed Doppler evaluation of both ovaries demonstrates normal
low-resistance arterial and venous waveforms.

Other findings

No abnormal free fluid.
IMPRESSION: Negative pelvic ultrasound. No evidence for torsion or ovarian mass.

## 2023-02-07 ENCOUNTER — Emergency Department (HOSPITAL_COMMUNITY)
Admission: EM | Admit: 2023-02-07 | Discharge: 2023-02-07 | Disposition: A | Discharge status: Home / Self Care | Payer: Medicaid Other | Attending: Emergency Medicine | Admitting: Emergency Medicine

## 2023-02-07 ENCOUNTER — Encounter (HOSPITAL_COMMUNITY): Payer: Self-pay

## 2023-02-07 ENCOUNTER — Other Ambulatory Visit: Payer: Self-pay

## 2023-02-07 ENCOUNTER — Emergency Department (HOSPITAL_COMMUNITY): Payer: Medicaid Other

## 2023-02-07 DIAGNOSIS — Z3491 Encounter for supervision of normal pregnancy, unspecified, first trimester: Secondary | ICD-10-CM

## 2023-02-07 DIAGNOSIS — B9689 Other specified bacterial agents as the cause of diseases classified elsewhere: Secondary | ICD-10-CM | POA: Diagnosis not present

## 2023-02-07 DIAGNOSIS — O23591 Infection of other part of genital tract in pregnancy, first trimester: Secondary | ICD-10-CM | POA: Insufficient documentation

## 2023-02-07 DIAGNOSIS — O26891 Other specified pregnancy related conditions, first trimester: Secondary | ICD-10-CM | POA: Diagnosis present

## 2023-02-07 LAB — URINALYSIS, W/ REFLEX TO CULTURE (INFECTION SUSPECTED)
Bilirubin Urine: NEGATIVE
Glucose, UA: NEGATIVE mg/dL
Hgb urine dipstick: NEGATIVE
Ketones, ur: 20 mg/dL — AB
Leukocytes,Ua: NEGATIVE
Nitrite: NEGATIVE
Protein, ur: NEGATIVE mg/dL
Specific Gravity, Urine: 1.029 (ref 1.005–1.030)
pH: 5 (ref 5.0–8.0)

## 2023-02-07 LAB — WET PREP, GENITAL
Sperm: NONE SEEN
Trich, Wet Prep: NONE SEEN
WBC, Wet Prep HPF POC: NONE SEEN — AB (ref <10)
Yeast Wet Prep HPF POC: NONE SEEN

## 2023-02-07 LAB — HCG, QUANTITATIVE, PREGNANCY: hCG, Beta Chain, Quant, S: 78 m[IU]/mL — ABNORMAL HIGH (ref <5)

## 2023-02-07 LAB — PREGNANCY, URINE: Preg Test, Ur: POSITIVE — AB

## 2023-02-07 MED ORDER — METRONIDAZOLE 500 MG PO TABS: 500.0000 mg | ORAL_TABLET | Freq: Two times a day (BID) | ORAL | 0 refills | Status: DC

## 2023-02-07 NOTE — Discharge Instructions (Addendum)
You were given an antibiotic for the bacterial vaginosis.  If you start having severe abdominal pain, heavy vaginal bleeding return to [facility] for evaluation.  Otherwise call the clinic to have a follow-up to make sure your pregnancy hormone is going up as expected.

## 2023-02-07 NOTE — ED Triage Notes (Signed)
Pt complaining of stomach cramps that has been present for 3 days. Pt has not tried any otc medications for the pain. Denies being on period. Pt does endorse some nausea.

## 2023-02-07 NOTE — ED Provider Notes (Signed)
[place] EMERGENCY DEPARTMENT AT [facility] Provider Note   CSN: [id number] Arrival date & time: 02/07/23  1607     History  Chief Complaint  Patient presents with   Abdominal Pain    Hannah Ramos is a 22 y.o. female.  Patient is a healthy 22 year old female with complaints of suprapubic abdominal pain for the last 3 days that is gradually worsening.  She has occasional nausea but denies any vomiting or fever.  She does report feeling the need to push when she urinates but denies dysuria.  Cannot remember when her last bowel movement was but denied having issues with constipation or diarrhea.  She is still been eating it does not make the pain worse.  She denies any vaginal bleeding or discharge.  She has been sexually active with the same partner for the last 5 years.  They do not use protection.  She does not take any OCPs or used any forms of birth control.  Last menstrual cycle was 01/09/2023.  The history is provided by the patient.  Abdominal Pain      Home Medications Prior to Admission medications   Medication Sig Start Date End Date Taking? Authorizing Provider  metroNIDAZOLE (FLAGYL) 500 MG tablet Take 1 tablet (500 mg total) by mouth 2 (two) times daily. 02/07/23  Yes Gwyneth Sprout, MD  doxycycline (VIBRAMYCIN) 100 MG capsule Take 1 capsule (100 mg total) by mouth 2 (two) times daily. 08/06/22   Fayrene Helper, PA-C  fluticasone (FLONASE) 50 MCG/ACT nasal spray Place 2 sprays into both nostrils daily for 7 days. 01/23/21 03/11/22  Long, Arlyss Repress, MD  ibuprofen (ADVIL) 600 MG tablet Take 1 tablet (600 mg total) by mouth every 6 (six) hours as needed. 08/06/22   Fayrene Helper, PA-C  tinidazole (TINDAMAX) 500 MG tablet S: Take 2 tabs PO BID x 2 days after treating other infection. 03/31/22   Genia Del, MD      Allergies    Peanut butter flavoring agent (non-screening) and Amoxicillin    Review of Systems   Review of Systems  Gastrointestinal:   Positive for abdominal pain.    Physical Exam Updated Vital Signs BP 127/77   Pulse 75   Temp 98.1 F (36.7 C) (Oral)   Resp 18   Ht 5\' 4"  (1.626 m)   Wt 83.9 kg   LMP 01/09/2023   SpO2 100%   BMI 31.76 kg/m  Physical Exam Vitals and nursing note reviewed.  Constitutional:      General: She is not in acute distress.    Appearance: She is well-developed.  HENT:     Head: Normocephalic and atraumatic.  Eyes:     Pupils: Pupils are equal, round, and reactive to light.  Cardiovascular:     Rate and Rhythm: Normal rate and regular rhythm.     Heart sounds: Normal heart sounds. No murmur heard.    No friction rub.  Pulmonary:     Effort: Pulmonary effort is normal.     Breath sounds: Normal breath sounds. No wheezing or rales.  Abdominal:     General: Bowel sounds are normal. There is no distension.     Palpations: Abdomen is soft.     Tenderness: There is abdominal tenderness in the suprapubic area. There is right CVA tenderness. There is no left CVA tenderness, guarding or rebound.  Genitourinary:    Vagina: Vaginal discharge present.     Cervix: No cervical motion tenderness.     Adnexa:  Right: No tenderness.         Left: No tenderness.       Comments: Thick white vaginal discharge without vaginal lesions.  No cervical motion tenderness or ovarian tenderness. Musculoskeletal:        General: No tenderness. Normal range of motion.     Comments: No edema  Skin:    General: Skin is warm and dry.     Findings: No rash.  Neurological:     Mental Status: She is alert and oriented to person, place, and time.     Cranial Nerves: No cranial nerve deficit.  Psychiatric:        Behavior: Behavior normal.     ED Results / Procedures / Treatments   Labs (all labs ordered are listed, but only abnormal results are displayed) Labs Reviewed  WET PREP, GENITAL - Abnormal; Notable for the following components:      Result Value   Clue Cells Wet Prep HPF POC PRESENT  (*)    WBC, Wet Prep HPF POC NONE SEEN (*)    All other components within normal limits  URINALYSIS, W/ REFLEX TO CULTURE (INFECTION SUSPECTED) - Abnormal; Notable for the following components:   Color, Urine AMBER (*)    APPearance HAZY (*)    Ketones, ur 20 (*)    Bacteria, UA RARE (*)    All other components within normal limits  PREGNANCY, URINE - Abnormal; Notable for the following components:   Preg Test, Ur POSITIVE (*)    All other components within normal limits  HCG, QUANTITATIVE, PREGNANCY - Abnormal; Notable for the following components:   hCG, Beta Chain, Quant, S 78 (*)    All other components within normal limits  HIV ANTIBODY (ROUTINE TESTING W REFLEX)  RPR  GC/CHLAMYDIA PROBE AMP ([place]) NOT AT [facility]    EKG None  Radiology US OB LESS THAN 14 WEEKS W/ OB TRANSVAGINAL AND DOPPLER Result Date: 02/07/2023 CLINICAL DATA:  [id number] with pelvic pain x1 day, beta HCG registers but is low measuring 78. EXAM: OBSTETRIC <14 WK [place] AND TRANSVAGINAL OB US DOPPLER ULTRASOUND OF OVARIES TECHNIQUE: Both transabdominal and transvaginal ultrasound examinations were performed for complete evaluation of the gestation as well as the maternal uterus, adnexal regions, and pelvic cul-de-sac. Transvaginal technique was performed to assess early pregnancy. Color and duplex Doppler ultrasound was utilized to evaluate blood flow to the ovaries. COMPARISON:  Pelvic ultrasound 03/19/2021. FINDINGS: Intrauterine gestational sac: None. Yolk sac:  None. Embryo:  Not visualized. Cardiac Activity: N/a. MSD: None. CRL: None. Subchorionic hemorrhage:  None visualized. Maternal uterus/adnexae: The uterus is anteverted and measures 8.4 x 3.6 x 4.5 cm, previously 6.3 x 3.3 x 3.4 cm. No fibroids or other mass are visualized. The endometrial echo complex is in the upper range of normal at 14.2 mm. No focal abnormality is seen. The technologist did not interrogate for color flow within the complex. The cervix is  unremarkable measuring 3.5 cm length. Right ovary is asymmetrically larger due to a 5.9 cm simple cyst. In total the right ovary is 6.3 x 6.2 x 6.7 cm. There is no evidence of torsion were present impression of low resistance arterial and venous flow to this ovary. The left ovary is sonographically unremarkable measuring 2.5 x 1.5 x 1.6 cm, with normal appearing color flow. There is a small volume of anechoic pelvic cul-de-sac fluid, which is nonspecific. This could be physiologic or due to cyst leakage. Pulsed Doppler evaluation of both ovaries demonstrates  normal appearing low-resistance arterial and venous waveforms. IMPRESSION: 1. Upper limits of normal endometrial echo complex thickness but no appreciable intrauterine or extrauterine gestation. 2. Differential diagnosis includes early normal pregnancy, failed pregnancy, and ectopic pregnancy. Follow-up as indicated. 3. 5.9 cm simple cyst right ovary. No evidence of torsion. Follow-up study recommended in 6-8 weeks for recharacterization. Electronically Signed   By: Almira Bar M.D.   On: 02/07/2023 21:16    Procedures Procedures    Medications Ordered in ED Medications - No data to display  ED Course/ Medical Decision Making/ A&P                                 Medical Decision Making Amount and/or Complexity of Data Reviewed Labs: ordered. Decision-making details documented in ED Course. Radiology: ordered and independent interpretation performed. Decision-making details documented in ED Course.  Risk Prescription drug management.   Patient presenting today with symptoms that could be related to premenstrual cramps versus UTI versus pregnancy versus possible STI.  Lower suspicion for kidney stone, appendicitis, acute ovarian pathology.  Patient was seen on 16 1324 and at that time was diagnosed with PID and was positive for gonorrhea and chlamydia.  RPR and HIV were negative.  She received Rocephin and doxycycline at that time and  reports no ongoing pain until 3 days ago. I independently interpreted patient's labs.  UA without signs of infection but urine pregnancy test is positive.  Patient will need a pelvic exam to screen for GC chlamydia, HIV and syphilis were sent again but were negative and June.  Will do an ultrasound to confirm IUP.  9:46 PM I independently interpreted patient's labs and hCG is 78, wet prep is clue cells present but no evidence of trichomonas or yeast.  I have independently visualized and interpreted pt's images today.  Ultrasound without obvious IUP, radiology reports upper limits of normal endometrial echocomplex thickness but no appreciable intrauterine or extrauterine gestation as well as a 5.9 cm simple cyst in the right ovary without evidence of torsion which will need repeat imaging in 6 to 8 weeks.  Suspect this is early pregnancy based on dates of patient's last menses.  Will plan on outpatient follow-up with OB and will treat bacterial vaginosis with Flagyl.  Patient is comfortable with this plan and discharged home in good condition.         Final Clinical Impression(s) / ED Diagnoses Final diagnoses:  First trimester pregnancy  Bacterial vaginosis in pregnancy    Rx / DC Orders ED Discharge Orders          Ordered    metroNIDAZOLE (FLAGYL) 500 MG tablet  2 times daily        02/07/23 2145              Gwyneth Sprout, MD 02/07/23 2148

## 2023-02-08 LAB — HIV ANTIBODY (ROUTINE TESTING W REFLEX): HIV Screen 4th Generation wRfx: NONREACTIVE

## 2023-02-08 LAB — RPR: RPR Ser Ql: NONREACTIVE

## 2023-02-08 LAB — GC/CHLAMYDIA PROBE AMP (~~LOC~~) NOT AT ARMC
Chlamydia: NEGATIVE
Comment: NEGATIVE
Comment: NORMAL
Neisseria Gonorrhea: NEGATIVE

## 2023-02-24 NOTE — L&D Delivery Note (Signed)
 [facility] Delivery Note  Hannah Ramos is a 23 y.o. G1P0000 s/p vag delivery at [redacted]w[redacted]d. She was admitted for latent labor with dx of gHTN.   ROM: 16h 55m with MSF fluid GBS Status: pos (Ancef  x 4) Maximum Maternal Temperature: 101.8 (Gent x 1)  Labor Progress: Hannah Ramos was admitted in latent labor with mild range BP elevations (neg labs and asymptomatic) giving dx of gHTN; she had AROM for augmentation; she was dx with Triple I by T 101.8 (no fetal tachycardia); she progressed to complete and pushed well x 1hr to vag delivery.  Delivery Date/Time: August 30th, 2025 at 1300 Delivery: In room with patient complete and pushing. Head delivered LOA initially with turtling at 1258; R ant axilla felt for when shoulder wasn't forthcoming; tight nuchal x 1 palpated and axilla not felt; posterior arm unable to be reached as well; McRoberts being administered continually; Dr Herchel called and arrived in a matter of seconds; pt assisted in turning to her L side and then into hands and knees; tight cord clamped and cut which facilitated delivery in <30 seconds from that time by posterior arm first followed by the body. Infant to the warmer immediately and awaiting NICU team. Cord blood drawn as well as arterial cord gas. Placenta delivered spontaneously with gentle cord traction. Fundus firm with massage and Pitocin . Labia, perineum, vagina, and cervix inspected and found to be intact (L labial abrasion and abrasion at introitus- hemostatic and not repaired).   Placenta: spont, intact; to L&D Complications: shoulder dystocia x 2 mins Lacerations: none EBL: 90cc Analgesia: epidural  Postpartum Planning [x]  message to sent to schedule follow-up at [facility] [x]  Lasix  & K+ 5d course  M2B and BabyRx  Infant: boy  APGARs 1/6/7  4030g (8lb 14.2oz)  Hannah Ramos, CNM  10/23/2023 1:55 PM

## 2023-04-15 LAB — DRUG MONITORING, PANEL 6 WITH CONFIRMATION, URINE
Cotinine, Urine: 4588
Nicotine, urine: 1693

## 2023-04-15 LAB — OB RESULTS CONSOLE RUBELLA ANTIBODY, IGM: Rubella: IMMUNE

## 2023-04-15 LAB — HEPATITIS B SURFACE ANTIBODY,QUALITATIVE: Hep B S Ab: NONREACTIVE

## 2023-04-15 LAB — OB RESULTS CONSOLE VARICELLA ZOSTER ANTIBODY, IGG: Varicella: IMMUNE

## 2023-04-15 LAB — HEPATITIS B SURFACE ANTIGEN: Hepatitis B Surface Antigen: NONREACTIVE

## 2023-04-15 LAB — OB RESULTS CONSOLE GC/CHLAMYDIA
Chlamydia: NEGATIVE
Neisseria Gonorrhea: NEGATIVE

## 2023-04-15 LAB — HEPATITIS B CORE ANTIBODY, TOTAL: Hep B Core Total Ab: NONREACTIVE

## 2023-04-15 LAB — OB RESULTS CONSOLE HGB/HCT, BLOOD
HCT: 32 (ref 29–41)
Hemoglobin: 10.7

## 2023-04-15 LAB — OB RESULTS CONSOLE PLATELET COUNT: Platelets: 180

## 2023-04-15 LAB — OB RESULTS CONSOLE ABO/RH: RH Type: POSITIVE

## 2023-04-15 LAB — OB RESULTS CONSOLE ANTIBODY SCREEN: Antibody Screen: NEGATIVE

## 2023-04-19 LAB — GLUCOSE TOLERANCE, 1 HOUR: Glucose, 1 Hour GTT: 154 — ABNORMAL HIGH

## 2023-04-19 LAB — COMPREHENSIVE METABOLIC PANEL (CC13)
ALBUMIN/GLOBULIN RATIO: 1.4
ALT: 75 — ABNORMAL HIGH (ref 3–30)
AST: 29
Albumin: 3.9
Alkaline Phosphatase: 81
BUN/Creatinine Ratio: 20
Bilirubin, Total: 0.4
Calcium: 9.3
Carbon Dioxide, Total: 25
Chloride: 102
Creat: 0.46 — ABNORMAL LOW
Globulin: 2.7
Glucose: 146 — ABNORMAL HIGH
Potassium: 3.1 — ABNORMAL LOW
Sodium: 136
Total Protein: 6.6 g/dL
Urea Nitrogen: 9
eGFR: 140

## 2023-04-19 LAB — OB RESULTS CONSOLE PLATELET COUNT: Platelets: 181

## 2023-04-19 LAB — PROTEIN,TOTAL AND PROTEIN ELECTROPHORESIS, RANDOM URINE(REFL)
Creatinine, Urine: 289 — ABNORMAL HIGH
PROTEIN,TOTAL,URINE: 37 — ABNORMAL HIGH
Protein/Creat. Ratio: 0.128
Protein/Creat. Ratio: 128

## 2023-04-19 LAB — OB RESULTS CONSOLE HGB/HCT, BLOOD
HCT: 29 (ref 29–41)
Hemoglobin: 9.8

## 2023-04-21 LAB — GLUCOSE TOLERANCE, 3 HOURS
Glucose 1 Hour: 152
Glucose 3 Hour: 117
Glucose, 2 hour: 140
Glucose, Fasting: 102

## 2023-05-18 ENCOUNTER — Encounter: Payer: Self-pay | Admitting: *Deleted

## 2023-05-18 ENCOUNTER — Other Ambulatory Visit: Payer: Self-pay

## 2023-05-18 DIAGNOSIS — O99210 Obesity complicating pregnancy, unspecified trimester: Secondary | ICD-10-CM | POA: Insufficient documentation

## 2023-05-20 LAB — DRUG MONITORING, PANEL 6 WITH CONFIRMATION, URINE
6-Acetylmorphine: NEGATIVE
Alcohol Metabolite: NEGATIVE
Amphetamine: NEGATIVE
BENZOYLECGONINE: 143 — ABNORMAL HIGH
Barbiturates: NEGATIVE
Benzodiazepam Lvl: NEGATIVE
Creat: 187.5
Marijuana Metabolite: 21 — ABNORMAL HIGH
Marijuana Metabolite: POSITIVE — AB
Methadone Metabolite: NEGATIVE
OXYCODONE: NEGATIVE
Opiates: NEGATIVE
Oxidant: NEGATIVE
Phencyclidine: NEGATIVE
pH: 6.3

## 2023-05-20 LAB — COMPREHENSIVE METABOLIC PANEL (CC13)
ALBUMIN/GLOBULIN RATIO: 1.4
ALT: 18 (ref 3–30)
AST: 12
Albumin: 3.9
Alkaline Phosphatase: 50
BUN/Creatinine Ratio: 21
Bilirubin, Total: 0.2
Calcium: 9.3
Carbon Dioxide, Total: 25
Chloride: 107
Creat: 0.42 — ABNORMAL LOW
Globulin: 2.8
Glucose: 84
Potassium: 3.6
Sodium: 139
Total Protein: 6.7 g/dL
Urea Nitrogen: 9
eGFR: 144

## 2023-05-20 LAB — OB RESULTS CONSOLE HGB/HCT, BLOOD
HCT: 27 — AB (ref 29–41)
Hemoglobin: 9

## 2023-05-25 ENCOUNTER — Ambulatory Visit: Payer: Medicaid Other

## 2023-05-25 ENCOUNTER — Ambulatory Visit: Payer: Medicaid Other | Attending: Obstetrics and Gynecology

## 2023-05-25 ENCOUNTER — Other Ambulatory Visit: Payer: Self-pay

## 2023-05-25 ENCOUNTER — Ambulatory Visit (HOSPITAL_BASED_OUTPATIENT_CLINIC_OR_DEPARTMENT_OTHER)

## 2023-05-25 ENCOUNTER — Other Ambulatory Visit: Payer: Self-pay | Admitting: Family

## 2023-05-25 VITALS — BP 118/76

## 2023-05-25 DIAGNOSIS — O99210 Obesity complicating pregnancy, unspecified trimester: Secondary | ICD-10-CM

## 2023-05-25 DIAGNOSIS — Z362 Encounter for other antenatal screening follow-up: Secondary | ICD-10-CM

## 2023-05-25 DIAGNOSIS — Z3A19 19 weeks gestation of pregnancy: Secondary | ICD-10-CM

## 2023-05-25 DIAGNOSIS — O99012 Anemia complicating pregnancy, second trimester: Secondary | ICD-10-CM | POA: Diagnosis not present

## 2023-05-25 DIAGNOSIS — D563 Thalassemia minor: Secondary | ICD-10-CM | POA: Insufficient documentation

## 2023-05-25 DIAGNOSIS — O10912 Unspecified pre-existing hypertension complicating pregnancy, second trimester: Secondary | ICD-10-CM

## 2023-05-25 DIAGNOSIS — O99810 Abnormal glucose complicating pregnancy: Secondary | ICD-10-CM

## 2023-05-25 DIAGNOSIS — O10012 Pre-existing essential hypertension complicating pregnancy, second trimester: Secondary | ICD-10-CM | POA: Diagnosis not present

## 2023-05-25 DIAGNOSIS — Z3689 Encounter for other specified antenatal screening: Secondary | ICD-10-CM

## 2023-05-25 NOTE — Progress Notes (Signed)
 [facility] at [facility] [address] Phone:  [phone]   Fax:  [phone]      In-Person Genetic Counseling Clinic Note:   I spoke with 23 y.o. Hannah Ramos today to discuss her carrier screening results. She was referred by Hannah Collins, MD. She was accompanied by FOB Hannah Ramos.   Pregnancy History:    G1P0. EGA: [redacted]w[redacted]d by LMP. EDD: 10/16/2023. Denies personal history of health conditions. Denies bleeding, infections, and fevers in this pregnancy. Denies using tobacco, alcohol, or street drugs in this pregnancy.   Family History:    A three-generation pedigree was created and scanned into Epic under the Media tab.  Hannah Ramos reports her 47 yo mother has epilepsy that started at 24 yo. She is currently on medication and only has seizure episodes when she does not take her medication. No known genetic testing was performed. We discussed that seizures can be genetic or multifactorial. In the case of epilepsy with no known genetic cause, the risk to the fetus with an affected third-degree relative may be slightly elevated over the general population risk of 1%. However, without knowing the etiology of the seizures in the family, precise risk assessment is limited.  Maternal ethnicity reported as Black and paternal ethnicity reported as Black. Denies Ashkenazi Jewish ancestry.  Family history not remarkable for consanguinity, individuals with birth defects, intellectual disability, autism spectrum disorder, multiple spontaneous abortions, still births, or unexplained neonatal death.   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 Carriers may have mild anemia. She screened negative for the other three conditions (CF, SMA, and beta-hemoglobinopathies). Negative carrier screening  reduces but does not eliminate the chance of being a carrier. Please see report for details.  We reviewed the genetics of alpha thalassemia, autosomal recessive mode of inheritance, and clinical features of these conditions. We reviewed that Hannah Ramos will pass down one functional copy of the alpha globin gene and one deletion (-?) in each pregnancy. Therefore, this pregnancy is not at increased risk for hemoglobin Bart's due to four deletions of the alpha globin genes (--/--) regardless of her reproductive partner's carrier status. The pregnancy may be at increased risk for hemoglobin H disease if her partner is an alpha thalassemia carrier in the cis configuration (--/??). We reviewed that this is more common in [place] Asian populations and less common in those with Black ancestry.   Given these results, we discussed and offered carrier screening for Hannah Ramos's reproductive partner. We reviewed the benefits and limitations of carrier screening and that it can detect most but not all carriers.  The couple consented carrier screening for FOB. His blood was drawn during today's visit. We will call him with the results. We reviewed that if both were found to be carriers, prenatal diagnosis through amniocentesis would be available. We reviewed the technical aspects, benefits, risks, and limitations of amniocentesis including the 1 in 500 risk for miscarriage. We also discussed that testing can be completed postnatally. Of note, the [place] Newborn Screening (NBS) Program screens all newborn babies for cystic fibrosis, spinal muscular atrophy, hemoglobinopathies, and numerous other conditions. It does not screen for alpha thalassemia.  Previous Testing Completed:  Low risk NIPS: Hannah Ramos previously completed noninvasive prenatal screening (NIPS) in this pregnancy. The result is low risk, consistent with a female fetus.  This screening significantly reduces but does not eliminate the chance that the current  pregnancy has Down syndrome (trisomy 10), trisomy 37, trisomy 12, common sex chromosome conditions, and 22q11.2 microdeletion syndrome. Please see report for details. There are many genetic conditions that cannot be detected by NIPS.    Plan of Care:   FOB carrier screening for alpha thalassemia was drawn today. We will call him with the results. Routine prenatal care.   Informed consent was obtained. All questions were answered.   45 minutes were spent on the date of the encounter in service to the patient including preparation, face-to-face consultation, discussion of test reports and available next steps, pedigree construction, genetic risk assessment, documentation, and care coordination.    Thank you for sharing in the care of Hannah Ramos with [place].  Please do not hesitate to contact us at [phone] if you have any questions.   Sheppard Plumber, MS, [facility] Certified Genetic Counselor   Genetic counseling student involved in appointment: No.

## 2023-05-28 ENCOUNTER — Ambulatory Visit: Payer: Self-pay

## 2023-06-07 ENCOUNTER — Encounter: Payer: Self-pay | Admitting: Advanced Practice Midwife

## 2023-06-07 DIAGNOSIS — O0990 Supervision of high risk pregnancy, unspecified, unspecified trimester: Secondary | ICD-10-CM | POA: Insufficient documentation

## 2023-06-07 DIAGNOSIS — D563 Thalassemia minor: Secondary | ICD-10-CM | POA: Insufficient documentation

## 2023-06-07 DIAGNOSIS — Z3400 Encounter for supervision of normal first pregnancy, unspecified trimester: Secondary | ICD-10-CM | POA: Insufficient documentation

## 2023-06-07 NOTE — Progress Notes (Deleted)
   PRENATAL VISIT NOTE  Subjective:  Hannah Ramos is a 23 y.o. G1P0000 at [redacted]w[redacted]d being seen today for ongoing prenatal care. She started prenatal care at [facility] and transferred to [facility] for ***. She is currently monitored for the following issues for this {Blank single:19197::"high-risk","low-risk"} pregnancy and has Obesity affecting pregnancy; Thalassemia alpha carrier; and Supervision of normal first pregnancy on their problem list. She has an elevated early 1 hour GTT and Nml 3 hour GTT.    Patient reports {sx:14538}.   .  .   . Denies leaking of fluid.   The following portions of the patient's history were reviewed and updated as appropriate: allergies, current medications, past family history, past medical history, past social history, past surgical history and problem list.   Objective:  There were no vitals filed for this visit.  Fetal Status:           General:  Alert, oriented and cooperative. Patient is in no acute distress.  Skin: Skin is warm and dry. No rash noted.   Cardiovascular: Normal heart rate noted  Respiratory: Normal respiratory effort, no problems with respiration noted  Abdomen: Soft, gravid, appropriate for gestational age.        Pelvic: {Blank single:19197::"Cervical exam performed in the presence of a chaperone","Cervical exam deferred"}        Extremities: Normal range of motion.     Mental Status: Normal mood and affect. Normal behavior. Normal judgment and thought content.   Assessment and Plan:  Pregnancy: G1P0000 at [redacted]w[redacted]d 1. Thalassemia alpha carrier (Primary) - Completed genetic counseling. Partner testing collected. *** 2. Encounter for supervision of normal first pregnancy in second trimester  3. Obesity affecting pregnancy, antepartum, unspecified obesity type  4. [redacted] weeks gestation of pregnancy - AFP *** {Blank single:19197::"Term","Preterm"} labor symptoms and general obstetric precautions including but not limited to  vaginal bleeding, contractions, leaking of fluid and fetal movement were reviewed in detail with the patient. Please refer to After Visit Summary for other counseling recommendations.   No follow-ups on file.  Future Appointments  Date Time Provider Department Center  06/08/2023  2:15 PM Dorlene Gary , CNM [facility]  06/22/2023 11:00 AM [facility] PROVIDER 1 [facility]  06/22/2023 11:30 AM [facility] US6 [facility]    Hannah Ramos  Hannah Ramos [facility] for [employer]

## 2023-06-08 ENCOUNTER — Telehealth: Payer: Self-pay

## 2023-06-08 ENCOUNTER — Encounter: Admitting: Advanced Practice Midwife

## 2023-06-08 DIAGNOSIS — Z3402 Encounter for supervision of normal first pregnancy, second trimester: Secondary | ICD-10-CM

## 2023-06-08 DIAGNOSIS — O99210 Obesity complicating pregnancy, unspecified trimester: Secondary | ICD-10-CM

## 2023-06-08 DIAGNOSIS — D563 Thalassemia minor: Secondary | ICD-10-CM

## 2023-06-08 DIAGNOSIS — Z3A21 21 weeks gestation of pregnancy: Secondary | ICD-10-CM

## 2023-06-08 NOTE — Telephone Encounter (Signed)
 Attempted to call FOB Jamarcus Kirkland DOB 09/05/1999 with his carrier screening results that were negative for alpha thalassemia. Unable to leave VM.  Georgean Kindle, MS, [facility] Certified Genetic Counselor [facility] [phone]

## 2023-06-17 ENCOUNTER — Telehealth: Payer: Self-pay | Admitting: Family Medicine

## 2023-06-17 DIAGNOSIS — F1210 Cannabis abuse, uncomplicated: Secondary | ICD-10-CM | POA: Insufficient documentation

## 2023-06-17 DIAGNOSIS — O0990 Supervision of high risk pregnancy, unspecified, unspecified trimester: Secondary | ICD-10-CM

## 2023-06-21 NOTE — Telephone Encounter (Signed)
Disregard this error

## 2023-06-22 ENCOUNTER — Ambulatory Visit: Attending: Maternal & Fetal Medicine | Admitting: Obstetrics

## 2023-06-22 ENCOUNTER — Ambulatory Visit

## 2023-06-22 DIAGNOSIS — Z3A23 23 weeks gestation of pregnancy: Secondary | ICD-10-CM | POA: Insufficient documentation

## 2023-06-22 DIAGNOSIS — Z148 Genetic carrier of other disease: Secondary | ICD-10-CM | POA: Insufficient documentation

## 2023-06-22 DIAGNOSIS — O99212 Obesity complicating pregnancy, second trimester: Secondary | ICD-10-CM

## 2023-06-22 DIAGNOSIS — O99012 Anemia complicating pregnancy, second trimester: Secondary | ICD-10-CM | POA: Diagnosis not present

## 2023-06-22 DIAGNOSIS — O99322 Drug use complicating pregnancy, second trimester: Secondary | ICD-10-CM | POA: Insufficient documentation

## 2023-06-22 DIAGNOSIS — E669 Obesity, unspecified: Secondary | ICD-10-CM | POA: Diagnosis not present

## 2023-06-22 DIAGNOSIS — Z362 Encounter for other antenatal screening follow-up: Secondary | ICD-10-CM | POA: Diagnosis present

## 2023-06-22 DIAGNOSIS — O99210 Obesity complicating pregnancy, unspecified trimester: Secondary | ICD-10-CM

## 2023-06-22 DIAGNOSIS — D563 Thalassemia minor: Secondary | ICD-10-CM

## 2023-06-22 DIAGNOSIS — F1290 Cannabis use, unspecified, uncomplicated: Secondary | ICD-10-CM | POA: Diagnosis not present

## 2023-06-22 NOTE — Progress Notes (Signed)
 MFM Consult Note  Hannah Ramos is currently at 23 weeks and 3 days.  She was seen as the views of the fetal anatomy were unable to be fully visualized during her last exam.  She denies any problems since her last exam.  She was informed that the fetal growth and amniotic fluid level appears appropriate for her gestational age.  The views of the fetal anatomy were visualized today.  There were no obvious anomalies noted.    The limitations of ultrasound in the detection of all anomalies was discussed.    Follow-up as indicated.   The patient stated that all of her questions were answered today.  A total of 10 minutes was spent counseling and coordinating the care for this patient.  Greater than 50% of the time was spent in direct face-to-face contact.

## 2023-06-28 ENCOUNTER — Encounter: Payer: Self-pay | Admitting: Advanced Practice Midwife

## 2023-06-28 DIAGNOSIS — O99320 Drug use complicating pregnancy, unspecified trimester: Secondary | ICD-10-CM | POA: Insufficient documentation

## 2023-06-28 HISTORY — DX: Drug use complicating pregnancy, unspecified trimester: O99.320

## 2023-06-29 ENCOUNTER — Encounter: Admitting: Family Medicine

## 2023-06-30 ENCOUNTER — Telehealth: Payer: Self-pay | Admitting: Family Medicine

## 2023-06-30 NOTE — Telephone Encounter (Signed)
 I tried to reach patient to get her rescheduled and received an error message for this phone number.

## 2023-07-27 ENCOUNTER — Inpatient Hospital Stay

## 2023-07-27 ENCOUNTER — Encounter: Payer: Self-pay | Admitting: Oncology

## 2023-07-27 ENCOUNTER — Inpatient Hospital Stay: Attending: Oncology | Admitting: Oncology

## 2023-07-27 VITALS — BP 109/71 | HR 110 | Temp 98.1°F | Resp 18 | Ht 64.0 in | Wt 197.9 lb

## 2023-07-27 DIAGNOSIS — O99013 Anemia complicating pregnancy, third trimester: Secondary | ICD-10-CM | POA: Diagnosis not present

## 2023-07-27 DIAGNOSIS — Z3A28 28 weeks gestation of pregnancy: Secondary | ICD-10-CM | POA: Diagnosis not present

## 2023-07-27 LAB — CBC (CANCER CENTER ONLY)
HCT: 26.2 % — ABNORMAL LOW (ref 36.0–46.0)
Hemoglobin: 8.2 g/dL — ABNORMAL LOW (ref 12.0–15.0)
MCH: 25.3 pg — ABNORMAL LOW (ref 26.0–34.0)
MCHC: 31.3 g/dL (ref 30.0–36.0)
MCV: 80.9 fL (ref 80.0–100.0)
Platelet Count: 155 10*3/uL (ref 150–400)
RBC: 3.24 MIL/uL — ABNORMAL LOW (ref 3.87–5.11)
RDW: 13.9 % (ref 11.5–15.5)
WBC Count: 9.9 10*3/uL (ref 4.0–10.5)
nRBC: 0 % (ref 0.0–0.2)

## 2023-07-27 LAB — IRON AND TIBC
Iron: 99 ug/dL (ref 28–170)
Saturation Ratios: 26 % (ref 10.4–31.8)
TIBC: 378 ug/dL (ref 250–450)
UIBC: 279 ug/dL

## 2023-07-27 LAB — FERRITIN: Ferritin: 17 ng/mL (ref 11–307)

## 2023-07-27 LAB — FOLATE: Folate: >40 ng/mL (ref >5.9)

## 2023-07-27 LAB — VITAMIN B12: Vitamin B-12: 147 pg/mL — ABNORMAL LOW (ref 180–914)

## 2023-07-27 NOTE — Progress Notes (Signed)
 [facility]  [phone] [phone]  ID: Hannah Ramos OB: [birth year]  MR#: [id number]  AOZ#:[id number]  Patient Care Team: Center, [facility] as PCP - General (General Practice) Davy Estimable, FNP as PCP - OBGYN (Family Medicine) Percy Bracken, MD as Consulting Physician (Obstetrics and Gynecology) Shellie Dials, MD as Consulting Physician (Hematology and Oncology)  CHIEF COMPLAINT: Anemia in third trimester pregnancy.  INTERVAL HISTORY: Patient is a 23 year old female in the third trimester of her pregnancy who was noted to have a significantly decreased hemoglobin.  She is referred for further evaluation and consideration of iron transfusions.  She has a history of both cocaine and marijuana use during pregnancy.  She currently feels well and is asymptomatic.  She has no neurologic complaints.  She denies any recent fevers or illnesses.  She has a good appetite and is gaining weight appropriately.  She has no chest pain shortness of breath, cough, or hemoptysis.  She denies any nausea, vomiting, constipation, or diarrhea.  She has no urinary complaints.  Patient offers no specific complaints today.  REVIEW OF SYSTEMS:   Review of Systems  Constitutional: Negative.  Negative for fever, malaise/fatigue and weight loss.  Respiratory: Negative.  Negative for cough, hemoptysis and shortness of breath.   Cardiovascular: Negative.  Negative for chest pain and leg swelling.  Gastrointestinal: Negative.  Negative for abdominal pain.  Genitourinary: Negative.  Negative for dysuria.  Musculoskeletal: Negative.  Negative for back pain.  Skin: Negative.  Negative for rash.  Neurological: Negative.  Negative for dizziness, focal weakness, weakness and headaches.  Psychiatric/Behavioral: Negative.  The patient is not nervous/anxious.     As per HPI. Otherwise, a complete review of systems is negative.  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   Gonorrhea     PAST SURGICAL HISTORY: Past Surgical History:  Procedure Laterality Date   HERNIA REPAIR      FAMILY HISTORY: Family History  Problem Relation Age of Onset   Hypertension Mother    Hypertension Maternal Grandmother    Cancer Maternal Grandfather     ADVANCED DIRECTIVES (Y/N):  N  HEALTH MAINTENANCE: Social History   Tobacco Use   Smoking status: Never    Passive exposure: Current   Smokeless tobacco: Never  Vaping Use   Vaping status: Never Used  Substance Use Topics   Alcohol use: Not Currently   Drug use: Not Currently    Types: Marijuana     Colonoscopy:  PAP:  Bone density:  Lipid panel:  Allergies  Allergen Reactions   Peanut Butter Flavoring Agent (Non-Screening)    Amoxicillin Rash    Hives to face    Current Outpatient Medications  Medication Sig Dispense Refill   Prenatal Vit-Fe Fumarate-FA (MULTIVITAMIN-PRENATAL) 27-0.8 MG TABS tablet Take 1 tablet by mouth daily.     ASPIRIN LOW DOSE 81 MG tablet Take 81 mg by mouth daily. (Patient not taking: Reported on 07/27/2023)     doxycycline  (VIBRAMYCIN ) 100 MG capsule Take 1 capsule (100 mg total) by mouth 2 (two) times daily. (Patient not taking: Reported on 07/27/2023) 20 capsule 0   fluticasone  (FLONASE ) 50 MCG/ACT nasal spray Place 2 sprays into both nostrils daily for 7 days. 1 g 0   ibuprofen  (ADVIL ) 600 MG tablet Take 1 tablet (600 mg total) by mouth every 6 (six) hours as needed. (Patient not taking: Reported on 07/27/2023) 30  tablet 0   metroNIDAZOLE  (FLAGYL ) 500 MG tablet Take 1 tablet (500 mg total) by mouth 2 (two) times daily. (Patient not taking: Reported on 07/27/2023) 14 tablet 0   tinidazole  (TINDAMAX ) 500 MG tablet S: Take 2 tabs PO BID x 2 days after treating other infection. (Patient not taking: Reported on 07/27/2023) 8 tablet 0   No current facility-administered  medications for this visit.    OBJECTIVE: Vitals:   07/27/23 1056  BP: 109/71  Pulse: (!) 110  Resp: 18  Temp: 98.1 F (36.7 C)  SpO2: 100%     Body mass index is 33.97 kg/m.    ECOG FS:0 - Asymptomatic  General: Well-developed, well-nourished, no acute distress. Eyes: Pink conjunctiva, anicteric sclera. HEENT: Normocephalic, moist mucous membranes. Lungs: No audible wheezing or coughing. Heart: Regular rate and rhythm. Abdomen: Appears appropriate for gestational age. Musculoskeletal: No edema, cyanosis, or clubbing. Neuro: Alert, answering all questions appropriately. Cranial nerves grossly intact. Skin: No rashes or petechiae noted. Psych: Normal affect. Lymphatics: No cervical, calvicular, axillary or inguinal LAD.   LAB RESULTS:  Lab Results  Component Value Date   NA 139 05/20/2023   K 3.6 05/20/2023   CL 107 05/20/2023   CO2 25 05/20/2023   GLUCOSE 91 06/24/2022   BUN 15 06/24/2022   CREATININE 0.42 (L) 05/20/2023   CREATININE 187.5 05/20/2023   CALCIUM 9.3 05/20/2023   PROT 6.7 05/20/2023   ALBUMIN 3.9 05/20/2023   AST 12 05/20/2023   ALT 18 05/20/2023   ALKPHOS 50 05/20/2023   BILITOT 0.3 06/24/2022   GFRNONAA >60 06/24/2022    Lab Results  Component Value Date   WBC 9.9 07/27/2023   NEUTROABS 2.9 06/24/2022   HGB 8.2 (L) 07/27/2023   HCT 26.2 (L) 07/27/2023   MCV 80.9 07/27/2023   PLT 155 07/27/2023     STUDIES: No results found.  ASSESSMENT:  Anemia in third trimester pregnancy  PLAN:    Anemia in third trimester pregnancy: Patient's hemoglobin has decreased to 8.2.  Iron stores, B12, folate level are pending at time of dictation.  She will benefit from 200 mg of IV Venofer and will return to clinic 5 times over the next 1 to 2 weeks for treatment.  She will then follow-up the week of August 11 prior to her due date for further evaluation, repeat laboratory work, and consideration of additional treatment if necessary. Pregnancy: Patient  reports her due date is October 16, 2023.  She is having a boy.  I spent a total of 45 minutes reviewing chart data, face-to-face evaluation with the patient, counseling and coordination of care as detailed above.   Patient expressed understanding and was in agreement with this plan. She also understands that She can call clinic at any time with any questions, concerns, or complaints.    Shellie Dials, MD   07/27/2023 12:06 PM

## 2023-07-27 NOTE — Progress Notes (Signed)
 Due August 23rd

## 2023-07-28 ENCOUNTER — Other Ambulatory Visit: Payer: Self-pay | Admitting: Oncology

## 2023-07-28 DIAGNOSIS — O99013 Anemia complicating pregnancy, third trimester: Secondary | ICD-10-CM

## 2023-08-02 ENCOUNTER — Inpatient Hospital Stay

## 2023-08-02 VITALS — BP 103/67 | HR 92 | Temp 97.7°F | Resp 58

## 2023-08-02 DIAGNOSIS — O99013 Anemia complicating pregnancy, third trimester: Secondary | ICD-10-CM | POA: Diagnosis not present

## 2023-08-02 MED ORDER — IRON SUCROSE 20 MG/ML IV SOLN
200.0000 mg | Freq: Once | INTRAVENOUS | Status: AC
  Administered 2023-08-02: 200 mg via INTRAVENOUS

## 2023-08-02 MED ORDER — CYANOCOBALAMIN 1000 MCG/ML IJ SOLN
1000.0000 ug | Freq: Once | INTRAMUSCULAR | Status: AC
  Administered 2023-08-02: 1000 ug via INTRAMUSCULAR
  Filled 2023-08-02: qty 1

## 2023-08-02 NOTE — Patient Instructions (Signed)
 [facility] - A DEPT OF [facility]  Discharge Instructions: Thank you for choosing [facility] to provide your oncology and hematology care.  If you have a lab appointment with the Cancer Center, please go directly to the Cancer Center and check in at the registration area.  Wear comfortable clothing and clothing appropriate for easy access to any Portacath or PICC line.   We strive to give you quality time with your provider. You may need to reschedule your appointment if you arrive late (15 or more minutes).  Arriving late affects you and other patients whose appointments are after yours.  Also, if you miss three or more appointments without notifying the office, you may be dismissed from the clinic at the provider's discretion.      For prescription refill requests, have your pharmacy contact our office and allow 72 hours for refills to be completed.    Today you received the following chemotherapy and/or immunotherapy agents Venofer and B12      To help prevent nausea and vomiting after your treatment, we encourage you to take your nausea medication as directed.  BELOW ARE SYMPTOMS THAT SHOULD BE REPORTED IMMEDIATELY: *FEVER GREATER THAN 100.4 F (38 C) OR HIGHER *CHILLS OR SWEATING *NAUSEA AND VOMITING THAT IS NOT CONTROLLED WITH YOUR NAUSEA MEDICATION *UNUSUAL SHORTNESS OF BREATH *UNUSUAL BRUISING OR BLEEDING *URINARY PROBLEMS (pain or burning when urinating, or frequent urination) *BOWEL PROBLEMS (unusual diarrhea, constipation, pain near the anus) TENDERNESS IN MOUTH AND THROAT WITH OR WITHOUT PRESENCE OF ULCERS (sore throat, sores in mouth, or a toothache) UNUSUAL RASH, SWELLING OR PAIN  UNUSUAL VAGINAL DISCHARGE OR ITCHING   Items with * indicate a potential emergency and should be followed up as soon as possible or go to the Emergency Department if any problems should occur.  Please show the CHEMOTHERAPY ALERT CARD or  IMMUNOTHERAPY ALERT CARD at check-in to the Emergency Department and triage nurse.  Should you have questions after your visit or need to cancel or reschedule your appointment, please contact [facility] - A DEPT OF [facility]  [phone] and follow the prompts.  Office hours are 8:00 a.m. to 4:30 p.m. Monday - Friday. Please note that voicemails left after 4:00 p.m. may not be returned until the following business day.  We are closed weekends and major holidays. You have access to a nurse at all times for urgent questions. Please call the main number to the clinic [phone] and follow the prompts.  For any non-urgent questions, you may also contact your provider using MyChart. We now offer e-Visits for anyone 28 and older to request care online for non-urgent symptoms. For details visit [email or web address].   Also download the MyChart app! Go to the app store, search "MyChart", open the app, select [place], and log in with your MyChart username and password.

## 2023-08-04 ENCOUNTER — Inpatient Hospital Stay

## 2023-08-04 VITALS — BP 132/70 | HR 102 | Resp 18

## 2023-08-04 DIAGNOSIS — O99013 Anemia complicating pregnancy, third trimester: Secondary | ICD-10-CM | POA: Diagnosis not present

## 2023-08-04 LAB — HGB FRACTIONATION BY HPLC
Hgb A: 97.7 % (ref 96.4–98.8)
Hgb C: 0 %
Hgb E: 0 %
Hgb F: 0 % (ref 0.0–2.0)
Hgb S: 0 %
Hgb Variant: 1.2 % — ABNORMAL HIGH

## 2023-08-04 LAB — HGB FRACTIONATION CASCADE: Hgb A2: 1.1 % — ABNORMAL LOW (ref 1.8–3.2)

## 2023-08-04 MED ORDER — IRON SUCROSE 20 MG/ML IV SOLN
200.0000 mg | Freq: Once | INTRAVENOUS | Status: AC
  Administered 2023-08-04: 200 mg via INTRAVENOUS
  Filled 2023-08-04: qty 10

## 2023-08-06 ENCOUNTER — Inpatient Hospital Stay

## 2023-08-09 ENCOUNTER — Inpatient Hospital Stay

## 2023-08-12 ENCOUNTER — Inpatient Hospital Stay

## 2023-10-04 ENCOUNTER — Inpatient Hospital Stay: Attending: Oncology

## 2023-10-04 DIAGNOSIS — D649 Anemia, unspecified: Secondary | ICD-10-CM | POA: Diagnosis not present

## 2023-10-04 DIAGNOSIS — O99013 Anemia complicating pregnancy, third trimester: Secondary | ICD-10-CM | POA: Diagnosis present

## 2023-10-04 LAB — CBC WITH DIFFERENTIAL/PLATELET
Abs Immature Granulocytes: 0.03 K/uL (ref 0.00–0.07)
Basophils Absolute: 0 K/uL (ref 0.0–0.1)
Basophils Relative: 0 %
Eosinophils Absolute: 0.1 K/uL (ref 0.0–0.5)
Eosinophils Relative: 1 %
HCT: 28.5 % — ABNORMAL LOW (ref 36.0–46.0)
Hemoglobin: 9.1 g/dL — ABNORMAL LOW (ref 12.0–15.0)
Immature Granulocytes: 0 %
Lymphocytes Relative: 37 %
Lymphs Abs: 2.8 K/uL (ref 0.7–4.0)
MCH: 25.8 pg — ABNORMAL LOW (ref 26.0–34.0)
MCHC: 31.9 g/dL (ref 30.0–36.0)
MCV: 80.7 fL (ref 80.0–100.0)
Monocytes Absolute: 0.7 K/uL (ref 0.1–1.0)
Monocytes Relative: 9 %
Neutro Abs: 3.9 K/uL (ref 1.7–7.7)
Neutrophils Relative %: 53 %
Platelets: 164 K/uL (ref 150–400)
RBC: 3.53 MIL/uL — ABNORMAL LOW (ref 3.87–5.11)
RDW: 14.2 % (ref 11.5–15.5)
WBC: 7.5 K/uL (ref 4.0–10.5)
nRBC: 0 % (ref 0.0–0.2)

## 2023-10-04 LAB — IRON AND TIBC
Iron: 86 ug/dL (ref 28–170)
Saturation Ratios: 24 % (ref 10.4–31.8)
TIBC: 365 ug/dL (ref 250–450)
UIBC: 279 ug/dL

## 2023-10-04 LAB — FERRITIN: Ferritin: 16 ng/mL (ref 11–307)

## 2023-10-04 LAB — VITAMIN B12: Vitamin B-12: 134 pg/mL — ABNORMAL LOW (ref 180–914)

## 2023-10-05 ENCOUNTER — Inpatient Hospital Stay: Admitting: Oncology

## 2023-10-05 ENCOUNTER — Inpatient Hospital Stay

## 2023-10-11 ENCOUNTER — Inpatient Hospital Stay

## 2023-10-11 ENCOUNTER — Inpatient Hospital Stay: Admitting: Oncology

## 2023-10-11 NOTE — Addendum Note (Signed)
 Addended by: KERRIN MAR on: 10/11/2023 11:44 AM  Modules accepted: Orders

## 2023-10-11 NOTE — Progress Notes (Addendum)
 Information was abstracted from prenatal records that were uploaded on 7.15.25. and 8.19.25. Problems for current pregnancy were not abstracted. These records are available for review in the [email or web address]  Media Tab.

## 2023-10-22 ENCOUNTER — Inpatient Hospital Stay (HOSPITAL_COMMUNITY): Admitting: Anesthesiology

## 2023-10-22 ENCOUNTER — Encounter (HOSPITAL_COMMUNITY): Payer: Self-pay | Admitting: Obstetrics and Gynecology

## 2023-10-22 ENCOUNTER — Other Ambulatory Visit: Payer: Self-pay

## 2023-10-22 ENCOUNTER — Inpatient Hospital Stay (HOSPITAL_COMMUNITY)
Admission: AD | Admit: 2023-10-22 | Discharge: 2023-10-25 | DRG: 805 | Disposition: A | Discharge status: Home / Self Care | Attending: Obstetrics & Gynecology | Admitting: Obstetrics & Gynecology

## 2023-10-22 DIAGNOSIS — F1290 Cannabis use, unspecified, uncomplicated: Secondary | ICD-10-CM | POA: Diagnosis present

## 2023-10-22 DIAGNOSIS — Z3490 Encounter for supervision of normal pregnancy, unspecified, unspecified trimester: Secondary | ICD-10-CM

## 2023-10-22 DIAGNOSIS — Z148 Genetic carrier of other disease: Secondary | ICD-10-CM

## 2023-10-22 DIAGNOSIS — O480 Post-term pregnancy: Secondary | ICD-10-CM | POA: Diagnosis not present

## 2023-10-22 DIAGNOSIS — O134 Gestational [pregnancy-induced] hypertension without significant proteinuria, complicating childbirth: Secondary | ICD-10-CM | POA: Diagnosis present

## 2023-10-22 DIAGNOSIS — O26893 Other specified pregnancy related conditions, third trimester: Secondary | ICD-10-CM | POA: Diagnosis present

## 2023-10-22 DIAGNOSIS — Z880 Allergy status to penicillin: Secondary | ICD-10-CM

## 2023-10-22 DIAGNOSIS — F1490 Cocaine use, unspecified, uncomplicated: Secondary | ICD-10-CM | POA: Diagnosis present

## 2023-10-22 DIAGNOSIS — Z7982 Long term (current) use of aspirin: Secondary | ICD-10-CM | POA: Diagnosis not present

## 2023-10-22 DIAGNOSIS — O9902 Anemia complicating childbirth: Secondary | ICD-10-CM | POA: Diagnosis present

## 2023-10-22 DIAGNOSIS — O99013 Anemia complicating pregnancy, third trimester: Secondary | ICD-10-CM | POA: Diagnosis present

## 2023-10-22 DIAGNOSIS — Z8249 Family history of ischemic heart disease and other diseases of the circulatory system: Secondary | ICD-10-CM | POA: Diagnosis not present

## 2023-10-22 DIAGNOSIS — O99824 Streptococcus B carrier state complicating childbirth: Secondary | ICD-10-CM | POA: Diagnosis present

## 2023-10-22 DIAGNOSIS — Z3A41 41 weeks gestation of pregnancy: Secondary | ICD-10-CM | POA: Diagnosis not present

## 2023-10-22 DIAGNOSIS — O99320 Drug use complicating pregnancy, unspecified trimester: Secondary | ICD-10-CM | POA: Diagnosis present

## 2023-10-22 DIAGNOSIS — B951 Streptococcus, group B, as the cause of diseases classified elsewhere: Secondary | ICD-10-CM | POA: Diagnosis present

## 2023-10-22 DIAGNOSIS — O99324 Drug use complicating childbirth: Secondary | ICD-10-CM | POA: Diagnosis present

## 2023-10-22 DIAGNOSIS — O411290 Chorioamnionitis, unspecified trimester, not applicable or unspecified: Secondary | ICD-10-CM | POA: Diagnosis not present

## 2023-10-22 DIAGNOSIS — O99820 Streptococcus B carrier state complicating pregnancy: Secondary | ICD-10-CM | POA: Diagnosis not present

## 2023-10-22 DIAGNOSIS — Z8759 Personal history of other complications of pregnancy, childbirth and the puerperium: Secondary | ICD-10-CM | POA: Diagnosis present

## 2023-10-22 DIAGNOSIS — O411230 Chorioamnionitis, third trimester, not applicable or unspecified: Secondary | ICD-10-CM | POA: Diagnosis present

## 2023-10-22 DIAGNOSIS — O0990 Supervision of high risk pregnancy, unspecified, unspecified trimester: Principal | ICD-10-CM

## 2023-10-22 DIAGNOSIS — F1210 Cannabis abuse, uncomplicated: Secondary | ICD-10-CM

## 2023-10-22 DIAGNOSIS — O139 Gestational [pregnancy-induced] hypertension without significant proteinuria, unspecified trimester: Secondary | ICD-10-CM | POA: Diagnosis present

## 2023-10-22 DIAGNOSIS — Z3A40 40 weeks gestation of pregnancy: Secondary | ICD-10-CM | POA: Diagnosis not present

## 2023-10-22 HISTORY — DX: Anemia, unspecified: D64.9

## 2023-10-22 LAB — PROTEIN / CREATININE RATIO, URINE
Creatinine, Urine: 96 mg/dL
Protein Creatinine Ratio: 0.18 mg/mg{creat} — ABNORMAL HIGH (ref 0.00–0.15)
Total Protein, Urine: 17 mg/dL

## 2023-10-22 LAB — RAPID URINE DRUG SCREEN, HOSP PERFORMED
Amphetamines: NOT DETECTED
Barbiturates: NOT DETECTED
Benzodiazepines: NOT DETECTED
Cocaine: NOT DETECTED
Opiates: NOT DETECTED
Tetrahydrocannabinol: NOT DETECTED

## 2023-10-22 LAB — URINALYSIS, ROUTINE W REFLEX MICROSCOPIC
Bilirubin Urine: NEGATIVE
Glucose, UA: NEGATIVE mg/dL
Hgb urine dipstick: NEGATIVE
Ketones, ur: NEGATIVE mg/dL
Leukocytes,Ua: NEGATIVE
Nitrite: NEGATIVE
Protein, ur: NEGATIVE mg/dL
Specific Gravity, Urine: 1.01 (ref 1.005–1.030)
pH: 6 (ref 5.0–8.0)

## 2023-10-22 LAB — TYPE AND SCREEN
ABO/RH(D): A POS
Antibody Screen: NEGATIVE

## 2023-10-22 LAB — COMPREHENSIVE METABOLIC PANEL WITH GFR
ALT: 14 U/L (ref 0–44)
AST: 17 U/L (ref 15–41)
Albumin: 2.9 g/dL — ABNORMAL LOW (ref 3.5–5.0)
Alkaline Phosphatase: 96 U/L (ref 38–126)
Anion gap: 10 (ref 5–15)
BUN: <5 mg/dL — ABNORMAL LOW (ref 6–20)
CO2: 18 mmol/L — ABNORMAL LOW (ref 22–32)
Calcium: 9 mg/dL (ref 8.9–10.3)
Chloride: 111 mmol/L (ref 98–111)
Creatinine, Ser: 0.54 mg/dL (ref 0.44–1.00)
GFR, Estimated: >60 mL/min (ref >60)
Glucose, Bld: 109 mg/dL — ABNORMAL HIGH (ref 70–99)
Potassium: 3 mmol/L — ABNORMAL LOW (ref 3.5–5.1)
Sodium: 139 mmol/L (ref 135–145)
Total Bilirubin: 0.4 mg/dL (ref 0.0–1.2)
Total Protein: 6.7 g/dL (ref 6.5–8.1)

## 2023-10-22 LAB — CBC
HCT: 32.5 % — ABNORMAL LOW (ref 36.0–46.0)
Hemoglobin: 10.2 g/dL — ABNORMAL LOW (ref 12.0–15.0)
MCH: 25.2 pg — ABNORMAL LOW (ref 26.0–34.0)
MCHC: 31.4 g/dL (ref 30.0–36.0)
MCV: 80.2 fL (ref 80.0–100.0)
Platelets: 161 K/uL (ref 150–400)
RBC: 4.05 MIL/uL (ref 3.87–5.11)
RDW: 14.3 % (ref 11.5–15.5)
WBC: 8.9 K/uL (ref 4.0–10.5)
nRBC: 0 % (ref 0.0–0.2)

## 2023-10-22 MED ORDER — OXYTOCIN BOLUS FROM INFUSION: 333.0000 mL | Freq: Once | INTRAVENOUS | Status: DC

## 2023-10-22 MED ORDER — LACTATED RINGERS IV SOLN: 500.0000 mL | Freq: Once | INTRAVENOUS | Status: DC

## 2023-10-22 MED ORDER — DIPHENHYDRAMINE HCL 50 MG/ML IJ SOLN: 12.5000 mg | INTRAMUSCULAR | Status: DC | PRN

## 2023-10-22 MED ORDER — OXYTOCIN-SODIUM CHLORIDE 30-0.9 UT/500ML-% IV SOLN
2.5000 [IU]/h | INTRAVENOUS | Status: DC
  Filled 2023-10-22: qty 500

## 2023-10-22 MED ORDER — CEFAZOLIN SODIUM-DEXTROSE 2-4 GM/100ML-% IV SOLN
2.0000 g | Freq: Once | INTRAVENOUS | Status: AC
  Administered 2023-10-22: 2 g via INTRAVENOUS
  Filled 2023-10-22: qty 100

## 2023-10-22 MED ORDER — PENICILLIN G POT IN DEXTROSE 60000 UNIT/ML IV SOLN: 3.0000 10*6.[IU] | INTRAVENOUS | Status: DC

## 2023-10-22 MED ORDER — OXYCODONE-ACETAMINOPHEN 5-325 MG PO TABS: 1.0000 | ORAL_TABLET | ORAL | Status: DC | PRN

## 2023-10-22 MED ORDER — SODIUM CHLORIDE 0.9 % IV SOLN: 5.0000 10*6.[IU] | Freq: Once | INTRAVENOUS | Status: DC

## 2023-10-22 MED ORDER — NIFEDIPINE 10 MG PO CAPS: 10.0000 mg | ORAL_CAPSULE | ORAL | Status: DC | PRN

## 2023-10-22 MED ORDER — PHENYLEPHRINE 80 MCG/ML (10ML) SYRINGE FOR IV PUSH (FOR BLOOD PRESSURE SUPPORT): 80.0000 ug | PREFILLED_SYRINGE | INTRAVENOUS | Status: DC | PRN

## 2023-10-22 MED ORDER — LACTATED RINGERS IV SOLN: INTRAVENOUS | Status: DC

## 2023-10-22 MED ORDER — NIFEDIPINE 10 MG PO CAPS: 20.0000 mg | ORAL_CAPSULE | ORAL | Status: DC | PRN

## 2023-10-22 MED ORDER — LABETALOL HCL 5 MG/ML IV SOLN: 40.0000 mg | INTRAVENOUS | Status: DC | PRN

## 2023-10-22 MED ORDER — LABETALOL HCL 5 MG/ML IV SOLN: 80.0000 mg | INTRAVENOUS | Status: DC | PRN

## 2023-10-22 MED ORDER — ONDANSETRON HCL 4 MG/2ML IJ SOLN
4.0000 mg | Freq: Four times a day (QID) | INTRAMUSCULAR | Status: DC | PRN
  Administered 2023-10-22: 4 mg via INTRAVENOUS
  Filled 2023-10-22: qty 2

## 2023-10-22 MED ORDER — FLEET ENEMA RE ENEM
1.0000 | ENEMA | RECTAL | Status: DC | PRN
Start: 2023-10-22 — End: 2023-10-23

## 2023-10-22 MED ORDER — HYDRALAZINE HCL 20 MG/ML IJ SOLN: 10.0000 mg | INTRAMUSCULAR | Status: DC | PRN

## 2023-10-22 MED ORDER — EPHEDRINE 5 MG/ML INJ: 10.0000 mg | INTRAVENOUS | Status: DC | PRN

## 2023-10-22 MED ORDER — ACETAMINOPHEN 325 MG PO TABS
650.0000 mg | ORAL_TABLET | ORAL | Status: DC | PRN
  Administered 2023-10-23 (×2): 650 mg via ORAL
  Filled 2023-10-22 (×2): qty 2

## 2023-10-22 MED ORDER — CEFAZOLIN SODIUM-DEXTROSE 1-4 GM/50ML-% IV SOLN
1.0000 g | Freq: Three times a day (TID) | INTRAVENOUS | Status: DC
  Administered 2023-10-23: 1 g via INTRAVENOUS
  Filled 2023-10-22: qty 50

## 2023-10-22 MED ORDER — LABETALOL HCL 5 MG/ML IV SOLN: 20.0000 mg | INTRAVENOUS | Status: DC | PRN

## 2023-10-22 MED ORDER — CEFAZOLIN SODIUM-DEXTROSE 1-4 GM/50ML-% IV SOLN
1.0000 g | INTRAVENOUS | Status: DC
  Administered 2023-10-22: 1 g via INTRAVENOUS
  Filled 2023-10-22: qty 50

## 2023-10-22 MED ORDER — OXYCODONE-ACETAMINOPHEN 5-325 MG PO TABS: 2.0000 | ORAL_TABLET | ORAL | Status: DC | PRN

## 2023-10-22 MED ORDER — LACTATED RINGERS IV SOLN: 500.0000 mL | INTRAVENOUS | Status: DC | PRN

## 2023-10-22 MED ORDER — FENTANYL-BUPIVACAINE-NACL 0.5-0.125-0.9 MG/250ML-% EP SOLN
12.0000 mL/h | EPIDURAL | Status: DC | PRN
  Administered 2023-10-22: 12 mL/h via EPIDURAL
  Filled 2023-10-22 (×2): qty 250

## 2023-10-22 MED ORDER — LIDOCAINE HCL (PF) 1 % IJ SOLN: 30.0000 mL | INTRAMUSCULAR | Status: DC | PRN

## 2023-10-22 MED ORDER — SOD CITRATE-CITRIC ACID 500-334 MG/5ML PO SOLN: 30.0000 mL | ORAL | Status: DC | PRN

## 2023-10-22 MED ORDER — LIDOCAINE HCL (PF) 1 % IJ SOLN
INTRAMUSCULAR | Status: DC | PRN
  Administered 2023-10-22: 5 mL via EPIDURAL

## 2023-10-22 NOTE — Anesthesia Procedure Notes (Addendum)
 Epidural Patient location during procedure: OB Start time: 10/22/2023 3:01 PM End time: 10/22/2023 3:17 PM  Staffing Anesthesiologist: Jefm Garnette LABOR, MD Performed: anesthesiologist   Preanesthetic Checklist Completed: patient identified, IV checked, site marked, risks and benefits discussed, surgical consent, monitors and equipment checked, pre-op evaluation and timeout performed  Epidural Patient position: sitting Prep: DuraPrep and site prepped and draped Patient monitoring: continuous pulse ox and blood pressure Approach: midline Location: L4-L5 Injection technique: LOR air  Needle:  Needle type: Tuohy  Needle gauge: 17 G Needle length: 9 cm and 9 Needle insertion depth: 7 cm Catheter type: closed end flexible Catheter size: 19 Gauge Catheter at skin depth: 13 cm Test dose: negative  Assessment Events: blood not aspirated, no cerebrospinal fluid, injection not painful, no injection resistance, no paresthesia and negative IV test  Additional Notes Patient identified. Risks/Benefits/Options discussed with patient including but not limited to bleeding, infection, nerve damage, paralysis, failed block, incomplete pain control, headache, blood pressure changes, nausea, vomiting, reactions to medication both or allergic, itching and postpartum back pain. Confirmed with bedside nurse the patient's most recent platelet count. Confirmed with patient that they are not currently taking any anticoagulation, have any bleeding history or any family history of bleeding disorders. Patient expressed understanding and wished to proceed. All questions were answered. Sterile technique was used throughout the entire procedure. Please see nursing notes for vital signs. Test dose was given through epidural needle and negative prior to continuing to dose epidural or start infusion. Warning signs of high block given to the patient including shortness of breath, tingling/numbness in hands, complete  motor block, or any concerning symptoms with instructions to call for help. Patient was given instructions on fall risk and not to get out of bed. All questions and concerns addressed with instructions to call with any issues.  1 Attempt (S) . Patient tolerated procedure well.

## 2023-10-22 NOTE — H&P (Addendum)
 OBSTETRIC ADMISSION HISTORY AND PHYSICAL  Hannah Ramos is a 23 y.o. female G1P0000 with IUP at [redacted]w[redacted]d by LMP presenting for SOL. She reports +FMs, No LOF, no VB, no blurry vision, headaches or peripheral edema, and RUQ pain.  She plans on formula feeding. She request depo-provera for birth control. She received her prenatal care at [facility]   Dating: By LMP --->  Estimated Date of Delivery: 10/16/23  Sono:    @[redacted]w[redacted]d , CWD, normal anatomy, cephalic presentation, anterior placental lie, 527g, 31% EFW   Prenatal History/Complications: Hx Cocaine and marijuana use, GBS positive, anemia  Past Medical History: Past Medical History:  Diagnosis Date   Anemia    Chlamydia    Cocaine use complicating pregnancy 06/28/2023   Gonorrhea     Past Surgical History: Past Surgical History:  Procedure Laterality Date   HERNIA REPAIR      Obstetrical History: OB History     Gravida  1   Para  0   Term  0   Preterm  0   AB  0   Living  0      SAB  0   IAB  0   Ectopic  0   Multiple  0   Live Births  0           Social History Social History   Socioeconomic History   Marital status: Single    Spouse name: Not on file   Number of children: Not on file   Years of education: Not on file   Highest education level: Not on file  Occupational History   Not on file  Tobacco Use   Smoking status: Never    Passive exposure: Current   Smokeless tobacco: Never  Vaping Use   Vaping status: Never Used  Substance and Sexual Activity   Alcohol use: Not Currently   Drug use: Not Currently    Types: Marijuana   Sexual activity: Yes    Partners: Male    Birth control/protection: None  Other Topics Concern   Not on file  Social History Narrative   Not on file   Social Drivers of Health   Financial Resource Strain: Low Risk  (07/27/2023)   Overall Financial Resource Strain (CARDIA)    Difficulty of Paying Living Expenses: Not hard at all  Food Insecurity: No  Food Insecurity (07/27/2023)   Hunger Vital Sign    Worried About Running Out of Food in the Last Year: Never true    Ran Out of Food in the Last Year: Never true  Transportation Needs: No Transportation Needs (07/27/2023)   PRAPARE - Administrator, Civil Service (Medical): No    Lack of Transportation (Non-Medical): No  Physical Activity: Not on file  Stress: Not on file  Social Connections: Not on file    Family History: Family History  Problem Relation Age of Onset   Hypertension Mother    Hypertension Maternal Grandmother    Cancer Maternal Grandfather     Allergies: Allergies  Allergen Reactions   Peanut Butter Flavoring Agent (Non-Screening)    Amoxicillin Rash    Hives to face    Medications Prior to Admission  Medication Sig Dispense Refill Last Dose/Taking   ASPIRIN LOW DOSE 81 MG tablet Take 81 mg by mouth daily.   10/22/2023   Prenatal Vit-Fe Fumarate-FA (MULTIVITAMIN-PRENATAL) 27-0.8 MG TABS tablet Take 1 tablet by mouth daily.   10/22/2023   doxycycline  (VIBRAMYCIN ) 100 MG capsule Take  1 capsule (100 mg total) by mouth 2 (two) times daily. (Patient not taking: Reported on 07/27/2023) 20 capsule 0    fluticasone  (FLONASE ) 50 MCG/ACT nasal spray Place 2 sprays into both nostrils daily for 7 days. 1 g 0    ibuprofen  (ADVIL ) 600 MG tablet Take 1 tablet (600 mg total) by mouth every 6 (six) hours as needed. (Patient not taking: Reported on 07/27/2023) 30 tablet 0    metroNIDAZOLE  (FLAGYL ) 500 MG tablet Take 1 tablet (500 mg total) by mouth 2 (two) times daily. (Patient not taking: Reported on 07/27/2023) 14 tablet 0    tinidazole  (TINDAMAX ) 500 MG tablet S: Take 2 tabs PO BID x 2 days after treating other infection. (Patient not taking: Reported on 07/27/2023) 8 tablet 0      Review of Systems   All systems reviewed and negative except as stated in HPI  Blood pressure (!) 129/99, pulse 89, temperature 98 F (36.7 C), temperature source Oral, resp. rate 17, height  5' 4 (1.626 m), weight 102.7 kg, last menstrual period 01/09/2023, SpO2 100%. General appearance: alert, cooperative, and no distress Lungs: clear to auscultation bilaterally Heart: regular rate and rhythm Abdomen: soft, non-tender; bowel sounds normal Pelvic: cervical exam as below; defer further check until after epidural due to contraction pain Extremities: Homans sign is negative, no sign of DVT Presentation: Previously cephalic April/2025, not yet confirmed with US  this admission  Fetal monitoringBaseline: 135 bpm, Variability: Good {> 6 bpm), Accelerations: Reactive, and Decelerations: Absent Uterine activityFrequency: Every 3-4 minutes Dilation: 2.5 Effacement (%): 80 Station: -2 Exam by:: weston,rn   Prenatal labs: ABO, Rh: A/Positive/-- (02/20 0000) Antibody: Negative (02/20 0000) Rubella: Immune (02/20 0000) RPR: NON REACTIVE (12/15 1758)  HBsAg:    HIV: Non Reactive (12/15 1758)  GBS:     No results found for: GBS GTT failed 1 hr passed 3 hr Genetic screening  low risk Anatomy US  normal  Immunization History  Administered Date(s) Administered   Tdap 11/18/2021    Prenatal Transfer Tool  Maternal Diabetes: No Genetic Screening: Normal Maternal Ultrasounds/Referrals: Normal Fetal Ultrasounds or other Referrals:  None Maternal Substance Abuse:  Yes:  Type: Marijuana, Cocaine Significant Maternal Medications:  Meds include: Other: none Significant Maternal Lab Results: Other: Group B strep Number of Prenatal Visits:greater than 3 verified prenatal visits Maternal Vaccinations:TDap Other Comments:  None   No results found for this or any previous visit (from the past 24 hours).  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 G1P0000 at [redacted]w[redacted]d here for SOL with contractions and cervical dilation, found to have elevated BP reading and admitted to L&D.   #Labor:Latent phase of labor with intact membranes. Plan to obtain epidural and give GBS ppx before further labor management. Continue with expectant management for now. #Pain: Epidural desired #FWB: Cat 1 tracing #GBS status:  GBS +, treating with ancef  for penicillin  adverse reaction #Feeding: Formula #Reproductive Life planning: Depo Provera #Circ: declined #Elevated BP: Systolic BP up to 146 in the MAU and repeat diastolic of 99. No hx of elevated BP. PEC labs wnl and pt asx. Continue to monitor BP this admission w/ procardia  prn. Avoid beta antagonists  in s/o cocaine use. Will start on lasix and K after delivery.  Buel Avers, MD  10/22/2023, 12:59 PM   Attestation of Attending Supervision of Resident: Evaluation and management procedures were performed by the learners: Family Medicine Resident under my supervision. I was immediately available for direct supervision, assistance and direction throughout this encounter.  I also confirm that I have verified the information documented in the resident's note, and that I have also personally reperformed the pertinent components of the physical exam and all of the medical decision making activities.  I have also made any necessary editorial changes.  Barabara Maier, DO FMOB Fellow, Faculty practice [facility], Center for [employer]

## 2023-10-22 NOTE — Plan of Care (Signed)
  Problem: Education: Goal: Knowledge of disease or condition will improve Outcome: Progressing Goal: Knowledge of the prescribed therapeutic regimen will improve Outcome: Progressing   Problem: Fluid Volume: Goal: Peripheral tissue perfusion will improve Outcome: Progressing   Problem: Clinical Measurements: Goal: Complications related to disease process, condition or treatment will be avoided or minimized Outcome: Progressing   Problem: Education: Goal: Knowledge of Childbirth will improve Outcome: Progressing Goal: Ability to make informed decisions regarding treatment and plan of care will improve Outcome: Progressing Goal: Ability to state and carry out methods to decrease the pain will improve Outcome: Progressing Goal: Individualized Educational Video(s) Outcome: Progressing   Problem: Coping: Goal: Ability to verbalize concerns and feelings about labor and delivery will improve Outcome: Progressing   Problem: Life Cycle: Goal: Ability to make normal progression through stages of labor will improve Outcome: Progressing Goal: Ability to effectively push during vaginal delivery will improve Outcome: Progressing   Problem: Role Relationship: Goal: Will demonstrate positive interactions with the child Outcome: Progressing   Problem: Safety: Goal: Risk of complications during labor and delivery will decrease Outcome: Progressing   Problem: Pain Management: Goal: Relief or control of pain from uterine contractions will improve Outcome: Progressing   Problem: Education: Goal: Knowledge of General Education information will improve Description: Including pain rating scale, medication(s)/side effects and non-pharmacologic comfort measures Outcome: Progressing   Problem: Health Behavior/Discharge Planning: Goal: Ability to manage health-related needs will improve Outcome: Progressing   Problem: Clinical Measurements: Goal: Ability to maintain clinical measurements  within normal limits will improve Outcome: Progressing Goal: Will remain free from infection Outcome: Progressing Goal: Diagnostic test results will improve Outcome: Progressing Goal: Respiratory complications will improve Outcome: Progressing Goal: Cardiovascular complication will be avoided Outcome: Progressing   Problem: Activity: Goal: Risk for activity intolerance will decrease Outcome: Progressing   Problem: Nutrition: Goal: Adequate nutrition will be maintained Outcome: Progressing   Problem: Coping: Goal: Level of anxiety will decrease Outcome: Progressing   Problem: Elimination: Goal: Will not experience complications related to bowel motility Outcome: Progressing Goal: Will not experience complications related to urinary retention Outcome: Progressing   Problem: Pain Managment: Goal: General experience of comfort will improve and/or be controlled Outcome: Progressing   Problem: Safety: Goal: Ability to remain free from injury will improve Outcome: Progressing   Problem: Skin Integrity: Goal: Risk for impaired skin integrity will decrease Outcome: Progressing

## 2023-10-22 NOTE — Progress Notes (Signed)
 Labor Progress Note Hannah Ramos is a 23 y.o. G1P0000 at [redacted]w[redacted]d presenting for SOL  S: She is feeling comfortable with her epidural. I talked with patient regarding UDS and she verbally consented to UDS.  O:  BP 130/74   Pulse 82   Temp 97.6 F (36.4 C) (Axillary)   Resp 16   Ht 5' 4 (1.626 m)   Wt 102.7 kg   LMP 01/09/2023 (Exact Date)   SpO2 100%   BMI 38.88 kg/m  Lab Results  Component Value Date   HGB 10.2 (L) 10/22/2023    Time: 9:03 PM  FHT: baseline bpm 130, moderate variability, accelerations present, decelerations none,   Contractions: q every 3 min mins,   AROM completed at 2045 with thick meconium noted CVE: Dilation: 4 Effacement (%): 90 Cervical Position: Posterior Station: -3 Presentation: Vertex Exam by:: Dr. Trudy   A&P: 23 y.o. G1P0000 [redacted]w[redacted]d admitted for SOOL #Labor: Latent Labor AROM with thick meconium #Pain: Epidural #FWB: Category I #GBS positive #Hx of cocaine/cannabis use, +UDS for cocaine and marijuana in March--UDS ordered #alpha thal carrier/anemia of pregnancy--Hb10.2 monitor Leeroy KATHEE Trudy, MD 9:03 PM

## 2023-10-22 NOTE — Anesthesia Preprocedure Evaluation (Signed)
 Anesthesia Evaluation  Patient identified by MRN, date of birth, ID band Patient awake    Reviewed: Allergy & Precautions, NPO status , Patient's Chart, lab work & pertinent test results  Airway Mallampati: II  TM Distance: >3 FB Neck ROM: Full    Dental no notable dental hx. (+) Teeth Intact, Dental Advisory Given   Pulmonary    Pulmonary exam normal breath sounds clear to auscultation       Cardiovascular Normal cardiovascular exam Rhythm:Regular Rate:Normal     Neuro/Psych    GI/Hepatic negative GI ROS, Neg liver ROS,,,  Endo/Other    Renal/GU negative Renal ROS     Musculoskeletal   Abdominal   Peds  Hematology Lab Results      Component                Value               Date                      WBC                      8.9                 10/22/2023                HGB                      10.2 (L)            10/22/2023                HCT                      32.5 (L)            10/22/2023                MCV                      80.2                10/22/2023                PLT                      161                 10/22/2023              Anesthesia Other Findings All : Amoxicillin  Reproductive/Obstetrics                              Anesthesia Physical Anesthesia Plan  ASA: 3  Anesthesia Plan: Epidural   Post-op Pain Management:    Induction:   PONV Risk Score and Plan:   Airway Management Planned:   Additional Equipment:   Intra-op Plan:   Post-operative Plan:   Informed Consent: I have reviewed the patients History and Physical, chart, labs and discussed the procedure including the risks, benefits and alternatives for the proposed anesthesia with the patient or authorized representative who has indicated his/her understanding and acceptance.       Plan Discussed with:   Anesthesia Plan Comments: (40.6 Wk Primagravida w BMI 38.8 for LEA)         Anesthesia Quick Evaluation

## 2023-10-22 NOTE — MAU Note (Addendum)
 Hannah Ramos is a 23 y.o. at [redacted]w[redacted]d here in MAU reporting: she's having ctxs that 5 minutes apart since 1100.  Denies VB and LOF.  Reports hasn't felt baby move today, woke up 1100 and hasn't eaten yet.    Scheduled for IOL on 10/27/2023 in [place].  LMP: 01/09/2023 Onset of complaint: today Pain score: 8 Vitals:   10/22/23 1203  BP: (!) 146/95  Pulse: 72  Resp: 18  Temp: 98 F (36.7 C)  SpO2: 100%     FHT: 136 bpm  Lab orders placed from triage: UA

## 2023-10-23 ENCOUNTER — Encounter (HOSPITAL_COMMUNITY): Payer: Self-pay | Admitting: Obstetrics and Gynecology

## 2023-10-23 DIAGNOSIS — O139 Gestational [pregnancy-induced] hypertension without significant proteinuria, unspecified trimester: Secondary | ICD-10-CM | POA: Diagnosis present

## 2023-10-23 DIAGNOSIS — O99820 Streptococcus B carrier state complicating pregnancy: Secondary | ICD-10-CM

## 2023-10-23 DIAGNOSIS — O480 Post-term pregnancy: Secondary | ICD-10-CM

## 2023-10-23 DIAGNOSIS — Z3A41 41 weeks gestation of pregnancy: Secondary | ICD-10-CM

## 2023-10-23 DIAGNOSIS — Z8759 Personal history of other complications of pregnancy, childbirth and the puerperium: Secondary | ICD-10-CM | POA: Diagnosis present

## 2023-10-23 DIAGNOSIS — B951 Streptococcus, group B, as the cause of diseases classified elsewhere: Secondary | ICD-10-CM | POA: Diagnosis present

## 2023-10-23 DIAGNOSIS — O411230 Chorioamnionitis, third trimester, not applicable or unspecified: Secondary | ICD-10-CM

## 2023-10-23 DIAGNOSIS — O99324 Drug use complicating childbirth: Secondary | ICD-10-CM

## 2023-10-23 DIAGNOSIS — O134 Gestational [pregnancy-induced] hypertension without significant proteinuria, complicating childbirth: Secondary | ICD-10-CM

## 2023-10-23 DIAGNOSIS — O411290 Chorioamnionitis, unspecified trimester, not applicable or unspecified: Secondary | ICD-10-CM | POA: Diagnosis not present

## 2023-10-23 HISTORY — DX: Personal history of other complications of pregnancy, childbirth and the puerperium: Z87.59

## 2023-10-23 LAB — CBC
HCT: 26.1 % — ABNORMAL LOW (ref 36.0–46.0)
Hemoglobin: 8.5 g/dL — ABNORMAL LOW (ref 12.0–15.0)
MCH: 25.9 pg — ABNORMAL LOW (ref 26.0–34.0)
MCHC: 32.6 g/dL (ref 30.0–36.0)
MCV: 79.6 fL — ABNORMAL LOW (ref 80.0–100.0)
Platelets: 132 K/uL — ABNORMAL LOW (ref 150–400)
RBC: 3.28 MIL/uL — ABNORMAL LOW (ref 3.87–5.11)
RDW: 14.1 % (ref 11.5–15.5)
WBC: 17.6 K/uL — ABNORMAL HIGH (ref 4.0–10.5)
nRBC: 0 % (ref 0.0–0.2)

## 2023-10-23 LAB — RPR: RPR Ser Ql: NONREACTIVE

## 2023-10-23 MED ORDER — DIBUCAINE (PERIANAL) 1 % EX OINT: 1.0000 | TOPICAL_OINTMENT | CUTANEOUS | Status: DC | PRN

## 2023-10-23 MED ORDER — ONDANSETRON HCL 4 MG PO TABS: 4.0000 mg | ORAL_TABLET | ORAL | Status: DC | PRN

## 2023-10-23 MED ORDER — POTASSIUM CHLORIDE 20 MEQ PO PACK
20.0000 meq | PACK | Freq: Every day | ORAL | Status: DC
  Filled 2023-10-23: qty 1

## 2023-10-23 MED ORDER — NIFEDIPINE ER OSMOTIC RELEASE 30 MG PO TB24
30.0000 mg | ORAL_TABLET | Freq: Every day | ORAL | Status: DC
  Administered 2023-10-23 – 2023-10-25 (×3): 30 mg via ORAL
  Filled 2023-10-23 (×3): qty 1

## 2023-10-23 MED ORDER — TERBUTALINE SULFATE 1 MG/ML IJ SOLN
INTRAMUSCULAR | Status: AC
  Administered 2023-10-23: 0.25 mg via SUBCUTANEOUS
  Filled 2023-10-23: qty 1

## 2023-10-23 MED ORDER — CEFAZOLIN SODIUM-DEXTROSE 2-4 GM/100ML-% IV SOLN
2.0000 g | Freq: Three times a day (TID) | INTRAVENOUS | Status: DC
  Administered 2023-10-23: 2 g via INTRAVENOUS
  Filled 2023-10-23 (×2): qty 100

## 2023-10-23 MED ORDER — BENZOCAINE-MENTHOL 20-0.5 % EX AERO: 1.0000 | INHALATION_SPRAY | CUTANEOUS | Status: DC | PRN

## 2023-10-23 MED ORDER — MEASLES, MUMPS & RUBELLA VAC IJ SOLR: 0.5000 mL | Freq: Once | INTRAMUSCULAR | Status: DC

## 2023-10-23 MED ORDER — ONDANSETRON HCL 4 MG/2ML IJ SOLN: 4.0000 mg | INTRAMUSCULAR | Status: DC | PRN

## 2023-10-23 MED ORDER — SIMETHICONE 80 MG PO CHEW: 80.0000 mg | CHEWABLE_TABLET | ORAL | Status: DC | PRN

## 2023-10-23 MED ORDER — IBUPROFEN 600 MG PO TABS
600.0000 mg | ORAL_TABLET | Freq: Four times a day (QID) | ORAL | Status: DC
  Administered 2023-10-23 – 2023-10-25 (×7): 600 mg via ORAL
  Filled 2023-10-23 (×8): qty 1

## 2023-10-23 MED ORDER — TETANUS-DIPHTH-ACELL PERTUSSIS 5-2.5-18.5 LF-MCG/0.5 IM SUSY: 0.5000 mL | PREFILLED_SYRINGE | Freq: Once | INTRAMUSCULAR | Status: DC

## 2023-10-23 MED ORDER — COCONUT OIL OIL: 1.0000 | TOPICAL_OIL | Status: DC | PRN

## 2023-10-23 MED ORDER — ACETAMINOPHEN 325 MG PO TABS: 650.0000 mg | ORAL_TABLET | ORAL | Status: DC | PRN

## 2023-10-23 MED ORDER — MEDROXYPROGESTERONE ACETATE 150 MG/ML IM SUSP: 150.0000 mg | Freq: Once | INTRAMUSCULAR | Status: DC

## 2023-10-23 MED ORDER — OXYCODONE HCL 5 MG PO TABS: 5.0000 mg | ORAL_TABLET | ORAL | Status: DC | PRN

## 2023-10-23 MED ORDER — FUROSEMIDE 20 MG PO TABS
20.0000 mg | ORAL_TABLET | Freq: Every day | ORAL | Status: DC
  Administered 2023-10-23 – 2023-10-25 (×3): 20 mg via ORAL
  Filled 2023-10-23 (×3): qty 1

## 2023-10-23 MED ORDER — WITCH HAZEL-GLYCERIN EX PADS: 1.0000 | MEDICATED_PAD | CUTANEOUS | Status: DC | PRN

## 2023-10-23 MED ORDER — SENNOSIDES-DOCUSATE SODIUM 8.6-50 MG PO TABS
2.0000 | ORAL_TABLET | ORAL | Status: DC
  Administered 2023-10-23: 2 via ORAL
  Filled 2023-10-23: qty 2

## 2023-10-23 MED ORDER — PRENATAL MULTIVITAMIN CH
1.0000 | ORAL_TABLET | Freq: Every day | ORAL | Status: DC
  Administered 2023-10-24 – 2023-10-25 (×2): 1 via ORAL
  Filled 2023-10-23 (×3): qty 1

## 2023-10-23 MED ORDER — TERBUTALINE SULFATE 1 MG/ML IJ SOLN: 0.2500 mg | Freq: Once | INTRAMUSCULAR | Status: AC

## 2023-10-23 MED ORDER — GENTAMICIN SULFATE 40 MG/ML IJ SOLN
5.0000 mg/kg | Freq: Once | INTRAVENOUS | Status: AC
  Administered 2023-10-23: 370 mg via INTRAVENOUS
  Filled 2023-10-23: qty 9.25

## 2023-10-23 MED ORDER — POTASSIUM CHLORIDE CRYS ER 20 MEQ PO TBCR
20.0000 meq | EXTENDED_RELEASE_TABLET | Freq: Every day | ORAL | Status: DC
  Administered 2023-10-23 – 2023-10-25 (×3): 20 meq via ORAL
  Filled 2023-10-23 (×3): qty 1

## 2023-10-23 MED ORDER — FUROSEMIDE 20 MG PO TABS
20.0000 mg | ORAL_TABLET | Freq: Every day | ORAL | Status: DC
  Filled 2023-10-23: qty 1

## 2023-10-23 MED ORDER — ZOLPIDEM TARTRATE 5 MG PO TABS: 5.0000 mg | ORAL_TABLET | Freq: Every evening | ORAL | Status: DC | PRN

## 2023-10-23 MED ORDER — POTASSIUM CHLORIDE 10 MEQ/100ML IV SOLN
10.0000 meq | INTRAVENOUS | Status: DC
  Filled 2023-10-23 (×2): qty 100

## 2023-10-23 MED ORDER — DIPHENHYDRAMINE HCL 25 MG PO CAPS: 25.0000 mg | ORAL_CAPSULE | Freq: Four times a day (QID) | ORAL | Status: DC | PRN

## 2023-10-23 MED ORDER — LACTATED RINGERS IV BOLUS
1000.0000 mL | Freq: Once | INTRAVENOUS | Status: AC
  Administered 2023-10-23: 1000 mL via INTRAVENOUS

## 2023-10-23 NOTE — Progress Notes (Signed)
 Labor Progress Note Hannah Ramos is a 23 y.o. G1P0000 at [redacted]w[redacted]d presenting for SOL  S: Having recurrent late decelerations on FHT, one audible deceleration. Discussed not indicated at this time, but if FHT worsen as c-section may be indicated for fetal safety. Discussed risks of CS including bleeding, infection, damage to nearby organs such as bowel, bladder, ureters, as well as baby. Rarely, bleeding is severe requiring transfusion or hysterectomy. Patient expressed understanding of the discussion.  O:  BP (!) 121/91   Pulse (!) 102   Temp 99.4 F (37.4 C) (Axillary)   Resp 18   Ht 5' 4 (1.626 m)   Wt 102.7 kg   LMP 01/09/2023 (Exact Date)   SpO2 100%   BMI 38.88 kg/m  Lab Results  Component Value Date   HGB 10.2 (L) 10/22/2023    Time: 6:16 AM  FHT: baseline bpm 110 with baseline changes up to 120 and back to 110, moderate variability, accelerations present, decelerations--recurrent late decelerations present  Contractions: q every 1-3 min mins,    CVE: 9.5 cm Dilation: Lip/rim Effacement (%): 100 Cervical Position: Posterior Station: 0 Presentation: Vertex Exam by:: Dr. Lona   A&P: 23 y.o. G1P0000 [redacted]w[redacted]d admitted for SOL Reviewed FHT with Dr. Jayne. No fetal acidemia with accelerations and moderate variability at this time, and no indication for cesarean section at this time. Will trial 1L LR and terbutaline  for fetal intrauterine resuscitation. #Labor: Active Labor AROM  #Pain: Epidural #FWB: Category II, #GBS negative   Leeroy [name], MD 6:16 AM

## 2023-10-23 NOTE — Discharge Summary (Signed)
 Postpartum Discharge Summary  Date of Service updated***     Patient Name: Hannah Ramos DOB: Jan 07, 2001 MRN: [id number]  Date of admission: 10/22/2023 Delivery date:10/23/2023 Delivering provider: HERMAN ROLIN SAILOR Date of discharge: 10/23/2023  Admitting diagnosis: Intrauterine pregnancy [Z34.90] Intrauterine pregnancy: [redacted]w[redacted]d     Secondary diagnosis:  Principal Problem:   Intrauterine pregnancy Active Problems:   Cocaine use complicating pregnancy   Anemia during pregnancy in third trimester   Chorioamnionitis   Gestational hypertension   Shoulder dystocia during labor and delivery   Positive testing for group B Streptococcus  Additional problems: none    Discharge diagnosis: Term Pregnancy Delivered, Gestational Hypertension, and Anemia                                              Post partum procedures:none Augmentation: AROM Complications: Intrauterine Inflammation or infection (Chorioamniotis); Shoulder dystocia x 2 mins  Hospital course: Onset of Labor With Vaginal Delivery      23 y.o. yo G1P0000 at [redacted]w[redacted]d was admitted in Latent Labor with new onset gHTN on 10/22/2023. Labor course was complicated by dx of gHTN by mild range elevations (neg labs and asymptomatic); dx of Triple I by T 101.8 IP (already given Ancef  for GBS+; added dose of Gent); and SD x 2 min (see Delivery Summary).  Membrane Rupture Time/Date: 8:45 PM,10/22/2023  Delivery Method:Vaginal, Spontaneous Operative Delivery:N/A Episiotomy: None Lacerations:  None Patient had a postpartum course complicated by ***.  She is ambulating, tolerating a regular diet, passing flatus, and urinating well. Patient is discharged home in stable condition on 10/23/23.  Newborn Data: Birth date:10/23/2023 Birth time:1:00 PM Gender:Female Living status:Living Apgars:1 ,6 , 7 Weight:4030 g (8lb 14.2oz)  Magnesium Sulfate received: No BMZ received: No Rhophylac:N/A MMR:N/A T-DaP:offered prenatally Flu: N/A RSV  Vaccine received: No Transfusion:No  Immunizations received: Immunization History  Administered Date(s) Administered   Tdap 11/18/2021    Physical exam  Vitals:   10/23/23 1309 10/23/23 1330 10/23/23 1346 10/23/23 1400  BP: (!) 127/109 128/83 130/81 132/89  Pulse: (!) 109 89 89 87  Resp:  16    Temp:  98.2 F (36.8 C)    TempSrc:  Oral    SpO2:      Weight:      Height:       General: {Exam; general:21111117} Lochia: {Desc; appropriate/inappropriate:30686::appropriate} Uterine Fundus: {Desc; firm/soft:30687} Incision: {Exam; incision:21111123} DVT Evaluation: {Exam; dvt:2111122} Labs: Lab Results  Component Value Date   WBC 8.9 10/22/2023   HGB 10.2 (L) 10/22/2023   HCT 32.5 (L) 10/22/2023   MCV 80.2 10/22/2023   PLT 161 10/22/2023      Latest Ref Rng & Units 10/22/2023    1:33 PM  CMP  Glucose 70 - 99 mg/dL 890   BUN 6 - 20 mg/dL <5   Creatinine 9.55 - 1.00 mg/dL 9.45   Sodium 864 - 854 mmol/L 139   Potassium 3.5 - 5.1 mmol/L 3.0   Chloride 98 - 111 mmol/L 111   CO2 22 - 32 mmol/L 18   Calcium 8.9 - 10.3 mg/dL 9.0   Total Protein 6.5 - 8.1 g/dL 6.7   Total Bilirubin 0.0 - 1.2 mg/dL 0.4   Alkaline Phos 38 - 126 U/L 96   AST 15 - 41 U/L 17   ALT 0 - 44 U/L 14    [place]  Score:     No data to display         No data recorded  After visit meds:  Allergies as of 10/23/2023       Reactions   Peanut Butter Flavoring Agent (non-screening)    Amoxicillin Rash   Hives to face     Med Rec must be completed prior to using this [facility]***        Discharge home in stable condition Infant Feeding: {Baby feeding:23562} Infant Disposition:{CHL IP OB HOME WITH FNUYZM:76418} Discharge instruction: per After Visit Summary and Postpartum booklet. Activity: Advance as tolerated. Pelvic rest for 6 weeks.  Diet: routine diet Future Appointments:No future appointments. Follow up Visit:  Loreli Suzen BIRCH, CNM  P [facility] Admin Pool Please schedule  this patient for Postpartum visit in: 6 weeks with the following provider: Any provider In-Person For C/S patients schedule nurse incision check in weeks 2 weeks: no High risk pregnancy complicated by: new gHTN; hx cocaine Delivery mode:  SVD Anticipated Birth Control:  Depo PP Procedures needed: RN BP check 1 week; routine Pap Schedule Integrated BH visit: no  10/23/2023 Suzen BIRCH Loreli, CNM

## 2023-10-23 NOTE — Lactation Note (Signed)
 This note was copied from a baby'Ramos chart.  NICU Lactation Consultation Note  Patient Name: Hannah Ramos Date: 10/23/2023 Age:21 hours  Reason for consult: Initial assessment; Primapara; 1st time breastfeeding; NICU baby; Term; Other (Comment) (Hx of cocaine and THC use during the pregnancy on 05/20/2023)  SUBJECTIVE Spoke to Hannah Ramos K and she confirmed that feeding choice is formula after NICU admission. Lactation services are completed at this time.  OBJECTIVE Infant data: No data recorded O2 Device: CPAP FiO2 (%): 21 %  Infant feeding assessment No data recorded  Maternal data: G1P1001 Vaginal, Spontaneous Current breast feeding challenges:: NICU admission Risk factor for low/delayed milk supply:: Primipara, prematurity, infant separation  ASSESSMENT Infant: Feeding Status: NPO  Maternal: Not pumping  INTERVENTIONS/PLAN Interventions: No data recorded Plan: Consult Status: Complete   Hannah Ramos Hannah Ramos 10/23/2023, 5:10 PM

## 2023-10-23 NOTE — Progress Notes (Signed)
 Patient ID: Hannah Ramos, female   DOB: 02-15-2001, 23 y.o.   MRN: [id number]  In flying cowgirl; not feeling pressure/urge to push; had T 101.8 at 0740; s/p Gent at 0838  BPs 149/96, 133/87, T99.6 FHR 115-120, +accels, occ mi variables, no decels Ctx q 3-4, spont Cx C/C/vtx 0  IUP@41 .0wks End 1st stage Triple I New gHTN  Will labor down x 1-2 hrs in anticipation of pressure increasing If not, will begin pushing anyway Anticipate vag delivery  Suzen JONETTA Gentry CNM 10/23/2023 9:39 AM

## 2023-10-24 NOTE — Progress Notes (Addendum)
 POSTPARTUM PROGRESS NOTE  Post Partum Day 1  Subjective:  Hannah Ramos is a 23 y.o. G1P1001 s/p SVD at [redacted]w[redacted]d.  No acute events overnight.  Pt denies problems with ambulating, voiding or po intake.  She denies nausea or vomiting.  Pain is well controlled.  She has had flatus. She has had bowel movement.  Lochia Small.   Objective: Blood pressure 121/67, pulse 91, temperature 98.3 F (36.8 C), temperature source Oral, resp. rate 18, height 5' 4 (1.626 m), weight 102.7 kg, last menstrual period 01/09/2023, SpO2 100%, unknown if currently breastfeeding.  Physical Exam:  General: alert, cooperative and no distress Chest: no respiratory distress Heart:regular rate, distal pulses intact Abdomen: soft, nontender,  Uterine Fundus: firm, appropriately tender DVT Evaluation: No calf swelling or tenderness Extremities: bilateral non-pitting lower extremity edema Skin: warm, dry;   Recent Labs    10/22/23 1333 10/23/23 1428  HGB 10.2* 8.5*  HCT 32.5* 26.1*    Assessment/Plan: Hannah Ramos is a 23 y.o. G1P1001 s/p SVD at [redacted]w[redacted]d   PPD#1 - Doing well, meeting milestones #New gHTN: continue lasix  and potassium for total 5 days, anticipate edema will improve w Lasix  Contraception: depo Feeding: formula feeding Dispo: Plan for discharge 9/1.   LOS: 2 days   Shana Squires, MD 10/24/2023, 8:05 AM   Attestation of Supervision of Resident:  I confirm that I have verified the information documented in the resident's note and that I have also personally reperformed the history, physical exam and all medical decision making activities.  I have verified that all services and findings are accurately documented in this resident's note; and I agree with management and plan as outlined in the documentation. I have also made any necessary editorial changes.   Alain Sor, MD OB Fellow, Faculty Practice [facility], Center for [facility]

## 2023-10-24 NOTE — Anesthesia Postprocedure Evaluation (Signed)
 Anesthesia Post Note  Patient: Hannah Ramos  Procedure(s) Performed: AN AD HOC LABOR EPIDURAL     Patient location during evaluation: Mother Baby Anesthesia Type: Epidural Level of consciousness: awake and alert Pain management: pain level controlled Vital Signs Assessment: post-procedure vital signs reviewed and stable Respiratory status: spontaneous breathing, nonlabored ventilation and respiratory function stable Cardiovascular status: stable Postop Assessment: no headache, no backache, epidural receding, no apparent nausea or vomiting, patient able to bend at knees, able to ambulate and adequate PO intake Anesthetic complications: no   No notable events documented.  Last Vitals:  Vitals:   10/24/23 0526 10/24/23 1045  BP: 121/67 129/81  Pulse: 91 96  Resp: 18   Temp: 36.8 C   SpO2: 100%     Last Pain:  Vitals:   10/24/23 0800  TempSrc:   PainSc: 0-No pain   Pain Goal:                   [employer]

## 2023-10-24 NOTE — Clinical Social Work Maternal (Addendum)
 CLINICAL SOCIAL WORK MATERNAL/CHILD NOTE  Patient Details  Name: Hannah Ramos MRN: [id number] Date of Birth: 02/10/2001  Date:  06-21-23  Clinical Social Worker Initiating Note:  Sharyne Roulette, LCSWA Date/Time: Initiated:  10/24/23/1437     Child's Name:  Hannah Ramos, DOB: 2022/02/10   Biological Parents:  Mother, Father (FOB: Konnie Ramos, DOB: 09/05/2001)   Need for Interpreter:  None   Reason for Referral:  Current Substance Use/Substance Use During Pregnancy  , Parental Support of Premature Babies < 32 weeks/or Critically Ill babies   Address: [address] [place], [place] [address]   Phone number:  [phone] (home)     Additional phone number:   Household Members/Support Persons (HM/SP):   Household Member/Support Person 1   HM/SP Name Relationship DOB or Age  HM/SP -1 Ardia Pouch MOB's mother    HM/SP -2        HM/SP -3        HM/SP -4        HM/SP -5        HM/SP -6        HM/SP -7        HM/SP -8          Natural Supports (not living in the home):  Spouse/significant other   Professional Supports: None   Employment: Unemployed   Type of Work:     Education:  Engineer, agricultural   Homebound arranged:    Surveyor, quantity Resources:  Medicaid   Other Resources:  [facility]   Cultural/Religious Considerations Which May Impact Care:    Strengths:  Ability to meet basic needs  , Home prepared for child  , Pediatrician chosen   Psychotropic Medications:         Pediatrician:    [employer]  Pediatrician List:   [employer] [place]    [place]  ([facility])  [facility]      Pediatrician Fax Number:    Risk Factors/Current Problems:  Substance Use     Cognitive State:  Linear Thinking  , Able to Concentrate  , Alert  , Goal Oriented     Mood/Affect:  Calm  , Comfortable  , Interested  , Euthymic     CSW Assessment: CSW was consulted due to NICU  admission and substance use during pregnancy. CSW met with MOB at bedside to complete assessment. When CSW entered room, MOB was observed sitting in hospital bed. CSW introduced self and explained reason for consult. MOB presented as calm, was agreeable to consult and remained engaged throughout encounter.   CSW reviewed demographic information listed in chart. MOB reports she now lives at [address] [place], [place] [address] with her mother [facility]).   CSW inquired how MOB is feeling emotionally since delivery. MOB shared that she is feeling better after visiting with infant Hannah, sharing that his health is improving.  CSW inquired about MOB's mental health history. MOB denied a history of mental health symptoms/diagnoses. CSW inquired about supports. MOB identified her mother and FOB as supportive. CSW assessed for safety. MOB denied current SI/HI/domestic violence.   CSW provided education regarding the baby blues period vs. perinatal mood disorders, discussed treatment and gave resources for mental health follow up if concerns arise.  CSW recommends self-evaluation during the postpartum time period using the New Mom Checklist from Postpartum Progress and encouraged MOB to contact a medical professional if symptoms  are noted at any time.    MOB reports she has all needed items for infant, including a car seat and bassinet. MOB has chosen [facility] in [place] for infant's follow up care.  CSW informed MOB about hospital drug screen policy due to [facility] testing positive for cocaine and THC on a UDS during pregnancy (04/2023). CSW explained that infant's UDS resulted negative for illicit substances and CDS would continue to be monitored. CSW explained that a CPS report would be made if warranted. MOB expressed understanding. CSW inquired about substance use/barriers to prenatal care during pregnancy. MOB denied using illicit substances during pregnancy and stated that she  does not know how she tested positive for cocaine. MOB also denied using THC during pregnancy when asked. MOB reports she attended 5-6 prenatal care visits at [facility].   CSW provided review of Sudden Infant Death Syndrome (SIDS) precautions.    CSW and MOB discussed infant's NICU admission. CSW informed MOB about the NICU, what to expect, and resources/supports available while infant is admitted to the NICU. MOB reported that she feels updated about infant's care. MOB denied any transportation barriers with visiting infant in the NICU.  MOB states she receives [facility] benefits and plans to apply in person for Food Stamp benefits. MOB denied additional resource needs at this time.   CSW will continue to offer support and resources to family while infant remains in NICU. CSW identifies no barriers to discharge at this time.  CSW Plan/Description:  No Further Intervention Required/No Barriers to Discharge, Sudden Infant Death Syndrome (SIDS) Education, Perinatal Mood and Anxiety Disorder (PMADs) Education, Hospital Drug Screen Policy Information, CSW Will Continue to Monitor Umbilical Cord Tissue Drug Screen Results and Make Report if Warranted    Alyzabeth Pontillo K Hydee Fleece, LCSWA 2023/06/26, 2:41 PM

## 2023-10-25 MED ORDER — FUROSEMIDE 20 MG PO TABS: 20.0000 mg | ORAL_TABLET | Freq: Every day | ORAL | 0 refills | Status: AC

## 2023-10-25 MED ORDER — NIFEDIPINE ER 30 MG PO TB24: 30.0000 mg | ORAL_TABLET | Freq: Every day | ORAL | 0 refills | Status: AC

## 2023-10-25 MED ORDER — IBUPROFEN 600 MG PO TABS: 600.0000 mg | ORAL_TABLET | Freq: Four times a day (QID) | ORAL | 0 refills | Status: AC

## 2023-10-25 MED ORDER — POTASSIUM CHLORIDE CRYS ER 20 MEQ PO TBCR: 20.0000 meq | EXTENDED_RELEASE_TABLET | Freq: Every day | ORAL | 0 refills | Status: AC

## 2023-10-25 MED ORDER — ACETAMINOPHEN 325 MG PO TABS: 650.0000 mg | ORAL_TABLET | ORAL | 0 refills | Status: AC | PRN

## 2023-10-25 MED ORDER — SENNOSIDES-DOCUSATE SODIUM 8.6-50 MG PO TABS: 2.0000 | ORAL_TABLET | ORAL | 0 refills | Status: AC

## 2023-10-28 ENCOUNTER — Telehealth: Payer: Self-pay | Admitting: *Deleted

## 2023-10-28 NOTE — Telephone Encounter (Signed)
 Received message from [employer] that pt has not checked BP since 9/2 @ 7pm. BP at that time was 144/92. I called pt and she stated that she would check BP this morning before leaving house to see the baby. She reports having no H/A or visual disturbances. Further states that she feels great.

## 2023-11-03 ENCOUNTER — Ambulatory Visit

## 2023-11-03 ENCOUNTER — Telehealth (HOSPITAL_COMMUNITY): Payer: Self-pay | Admitting: *Deleted

## 2023-11-03 NOTE — Telephone Encounter (Signed)
 11/03/2023  Name: Hannah Ramos MRN: [id number] DOB: 2000/10/22  Reason for Call:  Transition of Care Hospital Discharge Call  Contact Status: Patient Contact Status: Unable to contact (voice mail box full, unable to leave a message)  Language assistant needed:          Follow-Up Questions:    Van Postnatal Depression Scale:  In the Past 7 Days:    PHQ2-9 Depression Scale:     Discharge Follow-up:    Post-discharge interventions: NA  Mliss Sieve, RN 11/03/2023 15:11

## 2023-11-23 ENCOUNTER — Ambulatory Visit (INDEPENDENT_AMBULATORY_CARE_PROVIDER_SITE_OTHER): Admitting: Advanced Practice Midwife

## 2023-11-23 ENCOUNTER — Other Ambulatory Visit (HOSPITAL_COMMUNITY)
Admission: RE | Admit: 2023-11-23 | Discharge: 2023-11-23 | Disposition: A | Discharge status: Home / Self Care | Source: Ambulatory Visit | Attending: Advanced Practice Midwife | Admitting: Advanced Practice Midwife

## 2023-11-23 VITALS — BP 125/80 | HR 82 | Wt 194.3 lb

## 2023-11-23 DIAGNOSIS — Z124 Encounter for screening for malignant neoplasm of cervix: Secondary | ICD-10-CM | POA: Diagnosis present

## 2023-11-23 DIAGNOSIS — Z3042 Encounter for surveillance of injectable contraceptive: Secondary | ICD-10-CM

## 2023-11-23 DIAGNOSIS — Z8759 Personal history of other complications of pregnancy, childbirth and the puerperium: Secondary | ICD-10-CM

## 2023-11-23 DIAGNOSIS — F1210 Cannabis abuse, uncomplicated: Secondary | ICD-10-CM | POA: Diagnosis not present

## 2023-11-23 DIAGNOSIS — O99320 Drug use complicating pregnancy, unspecified trimester: Secondary | ICD-10-CM

## 2023-11-23 DIAGNOSIS — R1111 Vomiting without nausea: Secondary | ICD-10-CM

## 2023-11-23 DIAGNOSIS — F1490 Cocaine use, unspecified, uncomplicated: Secondary | ICD-10-CM

## 2023-11-23 DIAGNOSIS — O133 Gestational [pregnancy-induced] hypertension without significant proteinuria, third trimester: Secondary | ICD-10-CM

## 2023-11-23 MED ORDER — PANTOPRAZOLE SODIUM 40 MG PO TBEC: 40.0000 mg | DELAYED_RELEASE_TABLET | Freq: Every day | ORAL | 0 refills | Status: AC

## 2023-11-23 MED ORDER — MEDROXYPROGESTERONE ACETATE 150 MG/ML IM SUSP
150.0000 mg | Freq: Once | INTRAMUSCULAR | Status: AC
  Administered 2023-11-23: 150 mg via INTRAMUSCULAR

## 2023-11-23 NOTE — Progress Notes (Signed)
 Post Partum Visit Note  JO BOOZE is a 23 y.o. G24P1001 female who presents for a postpartum visit. She is 4 weeks postpartum following a normal spontaneous vaginal delivery.  I have fully reviewed the prenatal and intrapartum course. The delivery was at 41 gestational weeks.  Anesthesia: epidural. Postpartum course has been NML. Baby is doing well. Baby is feeding by bottle - Similac Advance. Bleeding no bleeding. Bowel function is normal. Bladder function is normal. Patient is not sexually active. Contraception method is abstinence. Postpartum depression screening: negative.  Asked about cocaine on UDS this pregnancy. States she has not continued to use. Verified that she is not feeding baby breast milk.    The pregnancy intention screening data noted above was reviewed. Potential methods of contraception were discussed. The patient elected to proceed with No data recorded.   Edinburgh Postnatal Depression Scale - 11/23/23 1639       Edinburgh Postnatal Depression Scale:  In the Past 7 Days   I have been able to laugh and see the funny side of things. 0    I have looked forward with enjoyment to things. 0    I have blamed myself unnecessarily when things went wrong. 0    I have been anxious or worried for no good reason. 0    I have felt scared or panicky for no good reason. 0    Things have been getting on top of me. 0    I have been so unhappy that I have had difficulty sleeping. 0    I have felt sad or miserable. 0    I have been so unhappy that I have been crying. 0    The thought of harming myself has occurred to me. 0    Edinburgh Postnatal Depression Scale Total 0          Health Maintenance Due  Topic Date Due   HPV VACCINES (1 - 3-dose series) Never done   Meningococcal B Vaccine (1 of 2 - Standard) Never done   Hepatitis C Screening  Never done   Hepatitis B Vaccines 19-59 Average Risk (1 of 3 - 19+ 3-dose series) Never done   Cervical Cancer Screening (Pap  smear)  Never done   Influenza Vaccine  Never done   COVID-19 Vaccine (1 - 2024-25 season) Never done    The following portions of the patient's history were reviewed and updated as appropriate: allergies, current medications, past family history, past medical history, past social history, past surgical history, and problem list.  Review of Systems Pertinent items are noted in HPI.  Objective:  BP 125/80   Pulse 82   Wt 194 lb 4.8 oz (88.1 kg)   LMP 01/09/2023 (Exact Date)   Breastfeeding No   BMI 33.35 kg/m     General:  Alert, cooperative, NAD   Breasts:  not indicated  Lungs: Normal rate and effort  Heart:  Normal rate  Abdomen: Soft, NT, Fundus non-palpable  Wound NA  GU exam:  Deferred    Assessment:   1. Cannabis abuse (Primary) - ToxAssure Flex 15, Ur  2. Cocaine use complicating pregnancy - ToxAssure Flex 15, Ur - Denies continued use.  - Offered REACH clinic, declined.  - Discussed importance of not feeding breast milk while using cocaine.   3. Shoulder dystocia during labor and delivery - No sequelae. Encouraged pt discuss delivery planning in future pregnancies.   4. Gestational hypertension, third trimester - BP back to Nml.  No need for BP meds.   5. Postpartum care and examination  6. Cervical cancer screening - Cytology - PAP  7. Encounter for surveillance of injectable contraceptive - medroxyPROGESTERone  (DEPO-PROVERA ) injection 150 mg  8. Vomiting without nausea, unspecified vomiting type - pantoprazole (PROTONIX) 40 MG tablet; Take 1 tablet (40 mg total) by mouth daily.  Dispense: 14 tablet; Refill: 0    Plan:   Essential components of care per ACOG recommendations:  1.  Mood and well being: Patient with negative depression screening today. Reviewed local resources for support.  - Patient tobacco use? No.   - hx of drug use? No.    2. Infant care and feeding:  -Patient currently breastmilk feeding? No.  -Social determinants of  health (SDOH) reviewed in EPIC. No concerns.   3. Sexuality, contraception and birth spacing - Patient does not want a pregnancy in the next year.  Desired family size is 1 children.  - Reviewed reproductive life planning. Reviewed contraceptive methods based on pt preferences and effectiveness.  Patient desired Hormonal Injection today.   - Discussed birth spacing of 18 months  4. Sleep and fatigue -Encouraged family/partner/community support of 4 hrs of uninterrupted sleep to help with mood and fatigue  5. Physical Recovery  - Discussed patients delivery and complications. She describes her labor as good. - Patient had a vaginal delivery with shoulder dystocia . Patient had no laceration. Perineal healing reviewed. Patient expressed understanding - Patient has urinary incontinence? No. - Patient is safe to resume physical and sexual activity  6.  Health Maintenance - HM due items addressed Yes - Last pap smear None. Pap smear done at today's visit.  -Breast Cancer screening indicated? No.   7. Chronic Disease/Pregnancy Condition follow up: vomiting   Rx Protonix  - PCP follow up  November Sypher  Claudene, CNM [facility] [employer]

## 2023-11-23 NOTE — Progress Notes (Signed)
   Subjective:   Hannah Ramos is a 23 y.o. G1P1001 here today for postpartum care, ongoing substance use disorder management (cocaine, THC).  She reports infant is feeding and growing well. She reports he was diagnosed with cephalohematoma in NICU and she feels it is smaller and healing well.      Health Maintenance Due  Topic Date Due   HPV VACCINES (1 - 3-dose series) Never done   Meningococcal B Vaccine (1 of 2 - Standard) Never done   Hepatitis C Screening  Never done   Hepatitis B Vaccines 19-59 Average Risk (1 of 3 - 19+ 3-dose series) Never done   Cervical Cancer Screening (Pap smear)  Never done   Influenza Vaccine  Never done   COVID-19 Vaccine (1 - 2024-25 season) Never done    Past Medical History:  Diagnosis Date   Anemia    Chlamydia    Cocaine use complicating pregnancy 06/28/2023   Gonorrhea     Past Surgical History:  Procedure Laterality Date   HERNIA REPAIR      The following portions of the patient's history were reviewed and updated as appropriate: allergies, current medications, past family history, past medical history, past social history, past surgical history and problem list.     Objective:    Hannah Ramos is well appearing, non-distressed.  Clear communication and asking appropriate questions.    Assessment and Plan:  Routine newborn care reviewed including feeding and growth.  2 cans of Similac Advance given until she can schedule [facility] appointment.  Will follow at next REACH visit. Problem List Items Addressed This Visit   None   Routine preventative health maintenance measures emphasized. Please refer to After Visit Summary for other counseling recommendations.   No follow-ups on file.    Total face-to-face time with patient: 10 minutes.  Over 50% of encounter was spent on counseling and coordination of care.   Delon LITTIE Frater, NNP-BC Neonatal Nurse Practitioner Substance Exposed Newborn Consult at the [facility] [phone]

## 2023-11-28 LAB — TOXASSURE FLEX 15, UR
6-ACETYLMORPHINE IA: NEGATIVE ng/mL
7-aminoclonazepam: NOT DETECTED ng/mg{creat}
AMPHETAMINES IA: NEGATIVE ng/mL
Alpha-hydroxyalprazolam: NOT DETECTED ng/mg{creat}
Alpha-hydroxymidazolam: NOT DETECTED ng/mg{creat}
Alpha-hydroxytriazolam: NOT DETECTED ng/mg{creat}
Alprazolam: NOT DETECTED ng/mg{creat}
BARBITURATES IA: NEGATIVE ng/mL
BUPRENORPHINE: NEGATIVE
Benzodiazepines: NEGATIVE
Buprenorphine: NOT DETECTED ng/mg{creat}
Clonazepam: NOT DETECTED ng/mg{creat}
Creatinine: 180 mg/dL (ref >=20)
Desalkylflurazepam: NOT DETECTED ng/mg{creat}
Desmethyldiazepam: NOT DETECTED ng/mg{creat}
Desmethylflunitrazepam: NOT DETECTED ng/mg{creat}
Diazepam: NOT DETECTED ng/mg{creat}
ETHYL ALCOHOL Enzymatic: NEGATIVE g/dL
FENTANYL: NEGATIVE
Fentanyl: NOT DETECTED ng/mg{creat}
Flunitrazepam: NOT DETECTED ng/mg{creat}
Lorazepam: NOT DETECTED ng/mg{creat}
METHADONE IA: NEGATIVE ng/mL
METHADONE MTB IA: NEGATIVE ng/mL
Midazolam: NOT DETECTED ng/mg{creat}
Norbuprenorphine: NOT DETECTED ng/mg{creat}
Norfentanyl: NOT DETECTED ng/mg{creat}
OPIATE CLASS IA: NEGATIVE ng/mL
OXYCODONE CLASS IA: NEGATIVE ng/mL
Oxazepam: NOT DETECTED ng/mg{creat}
PHENCYCLIDINE IA: NEGATIVE ng/mL
TAPENTADOL, IA: NEGATIVE ng/mL
TRAMADOL IA: NEGATIVE ng/mL
Temazepam: NOT DETECTED ng/mg{creat}

## 2023-11-28 LAB — CANNABINOIDS, MS, UR RFX
Cannabinoids Confirmation: POSITIVE
Carboxy-THC: 489 ng/mg{creat}

## 2023-11-28 LAB — COCAINE AND MTB, MS, UR RFX
Benzoylecgonine: 83 ng/mg{creat}
Cocaethylene: NOT DETECTED ng/mg{creat}
Cocaine Confirmation: POSITIVE
Cocaine: NOT DETECTED ng/mg{creat}

## 2023-11-29 LAB — CYTOLOGY - PAP
Comment: NEGATIVE
Diagnosis: UNDETERMINED — AB
High risk HPV: POSITIVE — AB

## 2023-12-06 ENCOUNTER — Encounter: Payer: Self-pay | Admitting: Advanced Practice Midwife

## 2023-12-07 ENCOUNTER — Ambulatory Visit: Payer: Self-pay | Admitting: Advanced Practice Midwife

## 2023-12-07 ENCOUNTER — Encounter: Payer: Self-pay | Admitting: Advanced Practice Midwife

## 2023-12-07 DIAGNOSIS — R87610 Atypical squamous cells of undetermined significance on cytologic smear of cervix (ASC-US): Secondary | ICD-10-CM | POA: Insufficient documentation

## 2024-02-09 ENCOUNTER — Ambulatory Visit
